# Patient Record
Sex: Female | Born: 1950 | Race: Black or African American | Hispanic: No | Smoking: Never smoker
Health system: Southern US, Community
[De-identification: ages and names within clinical notes are randomized; demographics above are authoritative.]

## PROBLEM LIST (undated history)

## (undated) ENCOUNTER — Emergency Department (HOSPITAL_COMMUNITY): Payer: Medicare Other

## (undated) DIAGNOSIS — R000 Tachycardia, unspecified: Secondary | ICD-10-CM

## (undated) DIAGNOSIS — R140 Abdominal distension (gaseous): Secondary | ICD-10-CM

## (undated) DIAGNOSIS — F329 Major depressive disorder, single episode, unspecified: Secondary | ICD-10-CM

## (undated) DIAGNOSIS — C801 Malignant (primary) neoplasm, unspecified: Secondary | ICD-10-CM

## (undated) DIAGNOSIS — K760 Fatty (change of) liver, not elsewhere classified: Secondary | ICD-10-CM

## (undated) DIAGNOSIS — F419 Anxiety disorder, unspecified: Secondary | ICD-10-CM

## (undated) DIAGNOSIS — K219 Gastro-esophageal reflux disease without esophagitis: Secondary | ICD-10-CM

## (undated) DIAGNOSIS — I10 Essential (primary) hypertension: Secondary | ICD-10-CM

## (undated) DIAGNOSIS — M1990 Unspecified osteoarthritis, unspecified site: Secondary | ICD-10-CM

## (undated) DIAGNOSIS — F32A Depression, unspecified: Secondary | ICD-10-CM

## (undated) DIAGNOSIS — J302 Other seasonal allergic rhinitis: Secondary | ICD-10-CM

## (undated) DIAGNOSIS — I639 Cerebral infarction, unspecified: Secondary | ICD-10-CM

## (undated) HISTORY — PX: BREAST SURGERY: SHX581

## (undated) HISTORY — PX: TUBAL LIGATION: SHX77

## (undated) HISTORY — PX: JOINT REPLACEMENT: SHX530

## (undated) HISTORY — PX: TONSILLECTOMY: SUR1361

## (undated) HISTORY — PX: ABDOMINAL HYSTERECTOMY: SHX81

---

## 1997-12-24 ENCOUNTER — Encounter: Admission: RE | Admit: 1997-12-24 | Discharge: 1998-03-24 | Payer: Self-pay | Admitting: Internal Medicine

## 1998-02-04 ENCOUNTER — Emergency Department (HOSPITAL_COMMUNITY): Admission: EM | Admit: 1998-02-04 | Discharge: 1998-02-04 | Payer: Self-pay | Admitting: Emergency Medicine

## 1998-09-03 ENCOUNTER — Other Ambulatory Visit: Admission: RE | Admit: 1998-09-03 | Discharge: 1998-09-03 | Payer: Self-pay | Admitting: Obstetrics and Gynecology

## 1998-11-17 ENCOUNTER — Other Ambulatory Visit: Admission: RE | Admit: 1998-11-17 | Discharge: 1998-11-17 | Payer: Self-pay | Admitting: Radiology

## 1998-11-29 ENCOUNTER — Other Ambulatory Visit: Admission: RE | Admit: 1998-11-29 | Discharge: 1998-11-29 | Payer: Self-pay | Admitting: Radiology

## 1998-12-08 ENCOUNTER — Encounter: Admission: RE | Admit: 1998-12-08 | Discharge: 1999-03-08 | Payer: Self-pay | Admitting: *Deleted

## 1999-09-12 ENCOUNTER — Other Ambulatory Visit: Admission: RE | Admit: 1999-09-12 | Discharge: 1999-09-12 | Payer: Self-pay | Admitting: Obstetrics and Gynecology

## 1999-12-07 ENCOUNTER — Ambulatory Visit (HOSPITAL_COMMUNITY): Admission: RE | Admit: 1999-12-07 | Discharge: 1999-12-07 | Payer: Self-pay | Admitting: General Surgery

## 1999-12-14 ENCOUNTER — Encounter: Payer: Self-pay | Admitting: Internal Medicine

## 1999-12-14 ENCOUNTER — Encounter: Admission: RE | Admit: 1999-12-14 | Discharge: 1999-12-14 | Payer: Self-pay | Admitting: Internal Medicine

## 1999-12-28 ENCOUNTER — Encounter: Payer: Self-pay | Admitting: Emergency Medicine

## 1999-12-28 ENCOUNTER — Emergency Department (HOSPITAL_COMMUNITY): Admission: EM | Admit: 1999-12-28 | Discharge: 1999-12-28 | Payer: Self-pay | Admitting: Emergency Medicine

## 1999-12-31 ENCOUNTER — Encounter: Payer: Self-pay | Admitting: Emergency Medicine

## 1999-12-31 ENCOUNTER — Emergency Department (HOSPITAL_COMMUNITY): Admission: EM | Admit: 1999-12-31 | Discharge: 1999-12-31 | Payer: Self-pay | Admitting: Emergency Medicine

## 2000-10-01 ENCOUNTER — Other Ambulatory Visit: Admission: RE | Admit: 2000-10-01 | Discharge: 2000-10-01 | Payer: Self-pay | Admitting: Obstetrics and Gynecology

## 2000-11-04 ENCOUNTER — Emergency Department (HOSPITAL_COMMUNITY): Admission: EM | Admit: 2000-11-04 | Discharge: 2000-11-04 | Payer: Self-pay | Admitting: Emergency Medicine

## 2000-11-26 ENCOUNTER — Encounter: Payer: Self-pay | Admitting: Physical Medicine and Rehabilitation

## 2000-11-26 ENCOUNTER — Encounter
Admission: RE | Admit: 2000-11-26 | Discharge: 2000-11-26 | Payer: Self-pay | Admitting: Physical Medicine and Rehabilitation

## 2001-04-22 ENCOUNTER — Encounter: Payer: Self-pay | Admitting: Internal Medicine

## 2001-04-22 ENCOUNTER — Encounter: Admission: RE | Admit: 2001-04-22 | Discharge: 2001-04-22 | Payer: Self-pay | Admitting: Internal Medicine

## 2001-10-07 ENCOUNTER — Other Ambulatory Visit: Admission: RE | Admit: 2001-10-07 | Discharge: 2001-10-07 | Payer: Self-pay | Admitting: Obstetrics and Gynecology

## 2007-08-21 ENCOUNTER — Encounter: Admission: RE | Admit: 2007-08-21 | Discharge: 2007-08-21 | Payer: Self-pay | Admitting: Internal Medicine

## 2007-08-28 ENCOUNTER — Other Ambulatory Visit: Admission: RE | Admit: 2007-08-28 | Discharge: 2007-08-28 | Payer: Self-pay | Admitting: Obstetrics and Gynecology

## 2008-03-31 ENCOUNTER — Emergency Department (HOSPITAL_COMMUNITY): Admission: EM | Admit: 2008-03-31 | Discharge: 2008-04-01 | Payer: Self-pay | Admitting: Emergency Medicine

## 2008-04-01 ENCOUNTER — Ambulatory Visit: Payer: Self-pay | Admitting: Vascular Surgery

## 2008-04-01 ENCOUNTER — Ambulatory Visit (HOSPITAL_COMMUNITY): Admission: RE | Admit: 2008-04-01 | Discharge: 2008-04-01 | Payer: Self-pay | Admitting: Emergency Medicine

## 2008-04-01 ENCOUNTER — Encounter (INDEPENDENT_AMBULATORY_CARE_PROVIDER_SITE_OTHER): Payer: Self-pay | Admitting: Emergency Medicine

## 2009-08-16 ENCOUNTER — Encounter: Admission: RE | Admit: 2009-08-16 | Discharge: 2009-08-16 | Payer: Self-pay | Admitting: Gastroenterology

## 2011-03-28 LAB — D-DIMER, QUANTITATIVE: D-Dimer, Quant: 1.42 — ABNORMAL HIGH

## 2011-04-10 ENCOUNTER — Other Ambulatory Visit: Payer: Self-pay | Admitting: Obstetrics and Gynecology

## 2011-04-10 DIAGNOSIS — R102 Pelvic and perineal pain: Secondary | ICD-10-CM

## 2011-04-14 ENCOUNTER — Ambulatory Visit
Admission: RE | Admit: 2011-04-14 | Discharge: 2011-04-14 | Disposition: A | Discharge status: Home / Self Care | Payer: Medicare Other | Source: Ambulatory Visit | Attending: Obstetrics and Gynecology | Admitting: Obstetrics and Gynecology

## 2011-04-14 DIAGNOSIS — R102 Pelvic and perineal pain: Secondary | ICD-10-CM

## 2011-04-14 MED ORDER — IOHEXOL 300 MG/ML  SOLN: 100.0000 mL | Freq: Once | INTRAMUSCULAR | Status: AC | PRN

## 2011-11-18 ENCOUNTER — Emergency Department (HOSPITAL_COMMUNITY)
Admission: EM | Admit: 2011-11-18 | Discharge: 2011-11-18 | Disposition: A | Discharge status: Home / Self Care | Payer: Medicare Other | Attending: Emergency Medicine | Admitting: Emergency Medicine

## 2011-11-18 ENCOUNTER — Encounter (HOSPITAL_COMMUNITY): Payer: Self-pay

## 2011-11-18 ENCOUNTER — Other Ambulatory Visit: Payer: Self-pay

## 2011-11-18 ENCOUNTER — Emergency Department (HOSPITAL_COMMUNITY): Payer: Medicare Other

## 2011-11-18 DIAGNOSIS — H04123 Dry eye syndrome of bilateral lacrimal glands: Secondary | ICD-10-CM

## 2011-11-18 DIAGNOSIS — H04129 Dry eye syndrome of unspecified lacrimal gland: Secondary | ICD-10-CM | POA: Insufficient documentation

## 2011-11-18 DIAGNOSIS — F439 Reaction to severe stress, unspecified: Secondary | ICD-10-CM

## 2011-11-18 DIAGNOSIS — R200 Anesthesia of skin: Secondary | ICD-10-CM

## 2011-11-18 DIAGNOSIS — Z79899 Other long term (current) drug therapy: Secondary | ICD-10-CM | POA: Insufficient documentation

## 2011-11-18 DIAGNOSIS — Z733 Stress, not elsewhere classified: Secondary | ICD-10-CM | POA: Insufficient documentation

## 2011-11-18 DIAGNOSIS — R209 Unspecified disturbances of skin sensation: Secondary | ICD-10-CM | POA: Insufficient documentation

## 2011-11-18 DIAGNOSIS — I10 Essential (primary) hypertension: Secondary | ICD-10-CM | POA: Insufficient documentation

## 2011-11-18 HISTORY — DX: Essential (primary) hypertension: I10

## 2011-11-18 LAB — POCT I-STAT, CHEM 8
Hemoglobin: 13.3 g/dL (ref 12.0–15.0)
Sodium: 139 mEq/L (ref 135–145)
TCO2: 30 mmol/L (ref 0–100)

## 2011-11-18 MED ORDER — TETRACAINE HCL 0.5 % OP SOLN
OPHTHALMIC | Status: AC
  Filled 2011-11-18: qty 2

## 2011-11-18 MED ORDER — FLUORESCEIN SODIUM 1 MG OP STRP
ORAL_STRIP | OPHTHALMIC | Status: AC
  Filled 2011-11-18: qty 1

## 2011-11-18 NOTE — ED Notes (Signed)
Pt reports waking up this am with a red, blurry eye.  Feels like she has something stuck in her eye.  Reports that last night she did not have this sensation.  States that she has been seen at [facility] this am and was advised to come here for further eval of (L) sided facial numbness.  No difficulty ambulating, no drift or droop noted.  Pt A/o x 4.  No trouble speaking.

## 2011-11-18 NOTE — ED Provider Notes (Addendum)
History     CSN: [id number]  Arrival date & time 11/18/11  1559   First MD Initiated Contact with Patient 11/18/11 1638      Chief Complaint  Patient presents with  . Numbness     HPI Pt. Reports having lt. Facial numbness and lt. Eye is blurry symptoms began 1 month ago and are intermittent, pt. Denies any pain speech is Clear. She reports being under tremendous stress. [facility] sent her to [place] today for further eval  Past Medical History  Diagnosis Date  . Hypertension     History reviewed. No pertinent past surgical history.  No family history on file.  History  Substance Use Topics  . Smoking status: Never Smoker   . Smokeless tobacco: Not on file  . Alcohol Use: No    OB History    Grav Para Term Preterm Abortions TAB SAB Ect Mult Living                  Review of Systems  All other systems reviewed and are negative.    Allergies  Review of patient's allergies indicates no known allergies.  Home Medications   Current Outpatient Rx  Name Route Sig Dispense Refill  . ALPRAZOLAM 0.5 MG PO TABS Oral Take 0.5 mg by mouth at bedtime as needed. For sleep    . BUSPAR PO Oral Take 1 tablet by mouth 2 (two) times daily.    Marland Kitchen LISINOPRIL-HYDROCHLOROTHIAZIDE 20-25 MG PO TABS Oral Take 1 tablet by mouth daily.    Marland Kitchen METAXALONE 400 MG HALF TABLET Oral Take 800 mg by mouth 2 (two) times daily as needed. For muscle pain    . METOPROLOL TARTRATE PO Oral Take 1 tablet by mouth 2 (two) times daily.    Marland Kitchen PROBIOTIC FORMULA PO CAPS Oral Take 1 capsule by mouth daily.      BP 149/66  Pulse 59  Temp(Src) 98 F (36.7 C) (Oral)  Resp 16  SpO2 100%  Physical Exam  Nursing note and vitals reviewed. Constitutional: She is oriented to person, place, and time. She appears well-developed and well-nourished. No distress.  HENT:  Head: Normocephalic and atraumatic.  Eyes: Pupils are equal, round, and reactive to light. Left conjunctiva is injected.   Fluorescein stain of left eye reveals superficial stippling but no definite abrasion or laceration  Neck: Normal range of motion.  Cardiovascular: Normal rate and intact distal pulses.   Pulmonary/Chest: No respiratory distress.  Abdominal: Normal appearance. She exhibits no distension.  Musculoskeletal: Normal range of motion.  Neurological: She is alert and oriented to person, place, and time. No cranial nerve deficit.  Skin: Skin is warm and dry. No rash noted.  Psychiatric: She has a normal mood and affect. Her behavior is normal.    ED Course  Procedures (including critical care time)  Labs Reviewed  POCT I-STAT, CHEM 8 - Abnormal; Notable for the following:    Potassium 3.3 (*)    All other components within normal limits   Mr Brain Wo Contrast  11/18/2011  *RADIOLOGY REPORT*  Clinical Data: Right sided facial numbness.  MRI HEAD WITHOUT CONTRAST  Technique:  Multiplanar, multiecho pulse sequences of the brain and surrounding structures were obtained according to standard protocol without intravenous contrast.  Comparison: None.  Findings: The brain has a normal appearance without evidence of malformation, atrophy, old or acute infarction, mass lesion, hemorrhage, hydrocephalus or extra-axial collection.  No pituitary mass.  No inflammatory sinus disease.  Major  vessels are patent at the base of the brain.  IMPRESSION: Normal MRI of the brain.  Original Report Authenticated By: Thomasenia Sales, M.D.     1. Stress   2. Numbness   3. Dry eyes       MDM  After treatment in the ED the patient feels back to baseline and wants to go home.         Nelia Shi, MD 11/18/11 1945  Nelia Shi, MD 01/04/12 [phone]

## 2011-11-18 NOTE — ED Notes (Signed)
Pt denies any questions or pain upon discharge, verbalize understanding of discharge instructions and follow up.

## 2011-11-18 NOTE — ED Notes (Signed)
Pt being transported to MRI.

## 2011-11-18 NOTE — ED Notes (Signed)
Pt. Reports having lt. Facial numbness and lt. Eye is blurry symptoms began 1 month ago and are intermittent, pt. Denies any pain speech is  Clear. She reports being under tremendous stress. [facility] sent her to [place] today for further eval
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
## 2013-11-29 ENCOUNTER — Encounter (HOSPITAL_COMMUNITY): Payer: Self-pay | Admitting: Emergency Medicine

## 2013-11-29 ENCOUNTER — Emergency Department (HOSPITAL_COMMUNITY)
Admission: EM | Admit: 2013-11-29 | Discharge: 2013-11-29 | Disposition: A | Discharge status: Home / Self Care | Payer: Medicare Other | Attending: Emergency Medicine | Admitting: Emergency Medicine

## 2013-11-29 DIAGNOSIS — Z880 Allergy status to penicillin: Secondary | ICD-10-CM | POA: Insufficient documentation

## 2013-11-29 DIAGNOSIS — I10 Essential (primary) hypertension: Secondary | ICD-10-CM | POA: Insufficient documentation

## 2013-11-29 DIAGNOSIS — M25562 Pain in left knee: Secondary | ICD-10-CM

## 2013-11-29 DIAGNOSIS — M25569 Pain in unspecified knee: Secondary | ICD-10-CM | POA: Insufficient documentation

## 2013-11-29 DIAGNOSIS — Z79899 Other long term (current) drug therapy: Secondary | ICD-10-CM | POA: Insufficient documentation

## 2013-11-29 DIAGNOSIS — Z87828 Personal history of other (healed) physical injury and trauma: Secondary | ICD-10-CM | POA: Insufficient documentation

## 2013-11-29 MED ORDER — TRAMADOL HCL 50 MG PO TABS: 50.0000 mg | ORAL_TABLET | Freq: Four times a day (QID) | ORAL | Status: DC | PRN

## 2013-11-29 NOTE — ED Provider Notes (Signed)
CSN: [id number]     Arrival date & time 11/29/13  1521 History  This chart was scribed for non-physician practitioner working with Morgan Jacobsen, MD by Mercy Moore, ED Scribe. This patient was seen in room TR10C/TR10C and the patient's care was started at 6:52 PM.   Chief Complaint  Patient presents with  . Knee Pain     The history is provided by the patient. No language interpreter was used.   HPI Comments: Morgan Carrillo is a 63 y.o. female who presents to the Emergency Department complaining of intermittent lower left leg pain since falling over a month ago. Patient has completed physical therapy after her fall and reports improvement of her knee pain.  However, patient went for a walk this morning with her grand daugther and is now having increased pain in her left knee. Patient describes pain as a scraping, grating sensation inside of the knee.  Patient being seen by Dr. Rhona Raider.   Past Medical History  Diagnosis Date  . Hypertension    History reviewed. No pertinent past surgical history. History reviewed. No pertinent family history. History  Substance Use Topics  . Smoking status: Never Smoker   . Smokeless tobacco: Not on file  . Alcohol Use: No   OB History   Grav Para Term Preterm Abortions TAB SAB Ect Mult Living                 Review of Systems    Allergies  Codeine; Sulfa antibiotics; and Penicillins  Home Medications   Prior to Admission medications   Medication Sig Start Date End Date Taking? Authorizing Provider  ALPRAZolam Duanne Moron) 0.5 MG tablet Take 0.5 mg by mouth at bedtime as needed. For sleep    Historical Provider, MD  BusPIRone HCl (BUSPAR PO) Take 1 tablet by mouth 2 (two) times daily.    Historical Provider, MD  lisinopril-hydrochlorothiazide (PRINZIDE,ZESTORETIC) 20-25 MG per tablet Take 1 tablet by mouth daily.    Historical Provider, MD  metaxalone (SKELAXIN) 400 MG tablet Take 800 mg by mouth 2 (two) times daily as needed. For  muscle pain    Historical Provider, MD  METOPROLOL TARTRATE PO Take 1 tablet by mouth 2 (two) times daily.    Historical Provider, MD  Probiotic Product (PROBIOTIC FORMULA) CAPS Take 1 capsule by mouth daily.    Historical Provider, MD   Triage Vitals: BP 150/80  Pulse 70  Temp(Src) 97.9 F (36.6 C) (Oral)  Resp 18  Ht 5\' 4"  (1.626 m)  Wt 165 lb (74.844 kg)  BMI 28.31 kg/m2  SpO2 96% Physical Exam  Nursing note and vitals reviewed. Constitutional: She is oriented to person, place, and time. She appears well-developed and well-nourished. No distress.  HENT:  Head: Normocephalic and atraumatic.  Eyes: EOM are normal.  Neck: Neck supple. No tracheal deviation present.  Cardiovascular: Normal rate.   Pulmonary/Chest: Effort normal. No respiratory distress.  Musculoskeletal: Normal range of motion.  Left knee: Tender to palpation at the pes anserinus insertion. Ligaments are stable. Negative Lachman's and  McMurray. No swelling. No crepitus.   Neurological: She is alert and oriented to person, place, and time.  Skin: Skin is warm and dry.  Psychiatric: She has a normal mood and affect. Her behavior is normal.    ED Course  Procedures (including critical care time) DIAGNOSTIC STUDIES: Oxygen Saturation is 96% on room air, adequate by my interpretation.    COORDINATION OF CARE: 6:57 PM- Discussed treatment plan with patient  at bedside and patient agreed to plan.  SPLINT APPLICATION SPQZ/RAQT:6:22 PM Authorized by: Margarita Mail Consent: Verbal consent obtained. Risks and benefits: risks, benefits and alternatives were discussed Consent given by: patient Splint applied by: orthopedic technician Location details: L knee Splint type: sleeve Supplies used: knee sleeve Post-procedure: The splinted body part was neurovascularly unchanged following the procedure. Patient tolerance: Patient tolerated the procedure well with no immediate complications.      Labs Review Labs  Reviewed - No data to display  Imaging Review No results found.   EKG Interpretation None      MDM   Final diagnoses:  Knee pain, left   BP 150/80  Pulse 60  Temp(Src) 98.3 F (36.8 C) (Oral)  Resp 18  Ht 5\' 4"  (1.626 m)  Wt 165 lb (74.844 kg)  BMI 28.31 kg/m2  SpO2 99%  Patient PE consistent with either pes anserine bursitis or tendinitis. I have place the patient in compression sleeve and crutches.  RICE protocol. Tramadol for pain. i opted out of NSAIDS as patient is hypertensive. She has an established relationship with Dr. Rhona Raider and would like to follow with him.  I personally performed the services described in this documentation, which was scribed in my presence. The recorded information has been reviewed and is accurate.    Margarita Mail, PA-C 12/01/13 [phone]

## 2013-11-29 NOTE — Discharge Instructions (Signed)
Tendinitis Tendinitis is swelling and inflammation of the tendons. Tendons are band-like tissues that connect muscle to bone. Tendinitis commonly occurs in the:   Shoulders (rotator cuff).  Heels (Achilles tendon).  Elbows (triceps tendon). CAUSES Tendinitis is usually caused by overusing the tendon, muscles, and joints involved. When the tissue surrounding a tendon (synovium) becomes inflamed, it is called tenosynovitis. Tendinitis commonly develops in people whose jobs require repetitive motions. SYMPTOMS  Pain.  Tenderness.  Mild swelling. DIAGNOSIS Tendinitis is usually diagnosed by physical exam. Your caregiver may also order X-rays or other imaging tests. TREATMENT Your caregiver may recommend certain medicines or exercises for your treatment. HOME CARE INSTRUCTIONS   Use a sling or splint for as long as directed by your caregiver until the pain decreases.  Put ice on the injured area.  Put ice in a plastic bag.  Place a towel between your skin and the bag.  Leave the ice on for 15-20 minutes, 03-04 times a day.  Avoid using the limb while the tendon is painful. Perform gentle range of motion exercises only as directed by your caregiver. Stop exercises if pain or discomfort increase, unless directed otherwise by your caregiver.  Only take over-the-counter or prescription medicines for pain, discomfort, or fever as directed by your caregiver. SEEK MEDICAL CARE IF:   Your pain and swelling increase.  You develop new, unexplained symptoms, especially increased numbness in the hands. MAKE SURE YOU:   Understand these instructions.  Will watch your condition.  Will get help right away if you are not doing well or get worse. Document Released: 06/09/2000 Document Revised: 09/04/2011 Document Reviewed: 08/29/2010 [facility] Patient Information 2014 [place], [place].

## 2013-11-29 NOTE — ED Notes (Signed)
Pt reports hx of fall one month ago which caused pain to left lower leg, went to physical therapy for it. Did a walk this morning and now having pain to left knee. Ambulatory at triage.

## 2013-11-29 NOTE — ED Notes (Signed)
Pt d/c with grandchild, ambulatory with clutches, a pain level of 7. Discharge teaching done.

## 2013-11-29 NOTE — Progress Notes (Signed)
Orthopedic Tech Progress Note Patient Details:  Morgan Carrillo [facility] Oct 28, 1950 [id number]  Ortho Devices Type of Ortho Device: Crutches;Knee Sleeve Ortho Device/Splint Location: lle Ortho Device/Splint Interventions: Application   Keeana Pieratt 11/29/2013, 7:26 PM

## 2013-12-01 NOTE — ED Provider Notes (Signed)
Medical screening examination/treatment/procedure(s) were performed by non-physician practitioner and as supervising physician I was immediately available for consultation/collaboration.  [name], MD 12/01/13 [phone]

## 2014-07-22 ENCOUNTER — Other Ambulatory Visit (HOSPITAL_COMMUNITY): Payer: Self-pay | Admitting: Orthopaedic Surgery

## 2014-07-22 ENCOUNTER — Ambulatory Visit (HOSPITAL_COMMUNITY)
Admission: RE | Admit: 2014-07-22 | Discharge: 2014-07-22 | Disposition: A | Discharge status: Home / Self Care | Payer: BC Managed Care – PPO | Source: Ambulatory Visit | Attending: Orthopaedic Surgery | Admitting: Orthopaedic Surgery

## 2014-07-22 DIAGNOSIS — M25562 Pain in left knee: Secondary | ICD-10-CM | POA: Diagnosis not present

## 2014-07-22 DIAGNOSIS — Z01818 Encounter for other preprocedural examination: Secondary | ICD-10-CM | POA: Diagnosis not present

## 2014-07-22 DIAGNOSIS — Z01810 Encounter for preprocedural cardiovascular examination: Secondary | ICD-10-CM

## 2014-07-22 NOTE — Progress Notes (Signed)
*  PRELIMINARY RESULTS* Vascular Ultrasound Left lower extremity venous duplex has been completed.  Preliminary findings: no evidence of DVT.    Landry Mellow, RDMS, RVT  07/22/2014, 10:49 AM

## 2015-10-19 ENCOUNTER — Other Ambulatory Visit: Payer: Self-pay | Admitting: Internal Medicine

## 2015-10-19 DIAGNOSIS — R140 Abdominal distension (gaseous): Secondary | ICD-10-CM

## 2015-10-19 DIAGNOSIS — R109 Unspecified abdominal pain: Secondary | ICD-10-CM

## 2015-10-20 ENCOUNTER — Other Ambulatory Visit: Payer: Self-pay | Admitting: Gastroenterology

## 2015-10-25 ENCOUNTER — Ambulatory Visit
Admission: RE | Admit: 2015-10-25 | Discharge: 2015-10-25 | Disposition: A | Discharge status: Home / Self Care | Payer: Medicare Other | Source: Ambulatory Visit | Attending: Internal Medicine | Admitting: Internal Medicine

## 2015-10-25 DIAGNOSIS — R140 Abdominal distension (gaseous): Secondary | ICD-10-CM

## 2015-10-25 DIAGNOSIS — R109 Unspecified abdominal pain: Secondary | ICD-10-CM

## 2015-11-05 ENCOUNTER — Encounter (HOSPITAL_COMMUNITY): Payer: Self-pay | Admitting: *Deleted

## 2015-11-16 ENCOUNTER — Ambulatory Visit (HOSPITAL_COMMUNITY)
Admission: RE | Admit: 2015-11-16 | Discharge: 2015-11-16 | Disposition: A | Discharge status: Home / Self Care | Payer: Medicare Other | Source: Ambulatory Visit | Attending: Gastroenterology | Admitting: Gastroenterology

## 2015-11-16 ENCOUNTER — Encounter (HOSPITAL_COMMUNITY): Payer: Self-pay | Admitting: Anesthesiology

## 2015-11-16 ENCOUNTER — Ambulatory Visit (HOSPITAL_COMMUNITY): Payer: Medicare Other | Admitting: Anesthesiology

## 2015-11-16 ENCOUNTER — Encounter (HOSPITAL_COMMUNITY)
Admission: RE | Disposition: A | Discharge status: Home / Self Care | Payer: Self-pay | Source: Ambulatory Visit | Attending: Gastroenterology

## 2015-11-16 DIAGNOSIS — Z853 Personal history of malignant neoplasm of breast: Secondary | ICD-10-CM | POA: Insufficient documentation

## 2015-11-16 DIAGNOSIS — D123 Benign neoplasm of transverse colon: Secondary | ICD-10-CM | POA: Diagnosis not present

## 2015-11-16 DIAGNOSIS — I10 Essential (primary) hypertension: Secondary | ICD-10-CM | POA: Insufficient documentation

## 2015-11-16 DIAGNOSIS — Z8673 Personal history of transient ischemic attack (TIA), and cerebral infarction without residual deficits: Secondary | ICD-10-CM | POA: Insufficient documentation

## 2015-11-16 DIAGNOSIS — Z96652 Presence of left artificial knee joint: Secondary | ICD-10-CM | POA: Insufficient documentation

## 2015-11-16 DIAGNOSIS — K5900 Constipation, unspecified: Secondary | ICD-10-CM | POA: Diagnosis not present

## 2015-11-16 DIAGNOSIS — M1990 Unspecified osteoarthritis, unspecified site: Secondary | ICD-10-CM | POA: Insufficient documentation

## 2015-11-16 DIAGNOSIS — R140 Abdominal distension (gaseous): Secondary | ICD-10-CM | POA: Diagnosis not present

## 2015-11-16 DIAGNOSIS — E119 Type 2 diabetes mellitus without complications: Secondary | ICD-10-CM | POA: Diagnosis not present

## 2015-11-16 DIAGNOSIS — D125 Benign neoplasm of sigmoid colon: Secondary | ICD-10-CM | POA: Diagnosis not present

## 2015-11-16 HISTORY — DX: Other seasonal allergic rhinitis: J30.2

## 2015-11-16 HISTORY — DX: Malignant (primary) neoplasm, unspecified: C80.1

## 2015-11-16 HISTORY — DX: Tachycardia, unspecified: R00.0

## 2015-11-16 HISTORY — DX: Unspecified osteoarthritis, unspecified site: M19.90

## 2015-11-16 HISTORY — DX: Anxiety disorder, unspecified: F41.9

## 2015-11-16 HISTORY — DX: Depression, unspecified: F32.A

## 2015-11-16 HISTORY — DX: Major depressive disorder, single episode, unspecified: F32.9

## 2015-11-16 HISTORY — DX: Abdominal distension (gaseous): R14.0

## 2015-11-16 HISTORY — DX: Gastro-esophageal reflux disease without esophagitis: K21.9

## 2015-11-16 HISTORY — DX: Fatty (change of) liver, not elsewhere classified: K76.0

## 2015-11-16 HISTORY — DX: Cerebral infarction, unspecified: I63.9

## 2015-11-16 HISTORY — PX: COLONOSCOPY WITH PROPOFOL: SHX5780

## 2015-11-16 SURGERY — COLONOSCOPY WITH PROPOFOL
Anesthesia: Monitor Anesthesia Care

## 2015-11-16 MED ORDER — PHENYLEPHRINE HCL 10 MG/ML IJ SOLN
INTRAMUSCULAR | Status: DC | PRN
  Administered 2015-11-16: 80 ug via INTRAVENOUS

## 2015-11-16 MED ORDER — PROPOFOL 500 MG/50ML IV EMUL
INTRAVENOUS | Status: DC | PRN
  Administered 2015-11-16: 50 mg via INTRAVENOUS

## 2015-11-16 MED ORDER — LACTATED RINGERS IV SOLN
INTRAVENOUS | Status: DC
  Administered 2015-11-16: 09:00:00 via INTRAVENOUS
  Administered 2015-11-16: 1000 mL via INTRAVENOUS

## 2015-11-16 MED ORDER — PROPOFOL 500 MG/50ML IV EMUL
INTRAVENOUS | Status: DC | PRN
  Administered 2015-11-16: 150 ug/kg/min via INTRAVENOUS

## 2015-11-16 MED ORDER — PROPOFOL 10 MG/ML IV BOLUS
INTRAVENOUS | Status: AC
  Filled 2015-11-16: qty 40

## 2015-11-16 MED ORDER — PHENYLEPHRINE 40 MCG/ML (10ML) SYRINGE FOR IV PUSH (FOR BLOOD PRESSURE SUPPORT)
PREFILLED_SYRINGE | INTRAVENOUS | Status: AC
  Filled 2015-11-16: qty 10

## 2015-11-16 MED ORDER — SODIUM CHLORIDE 0.9 % IV SOLN: INTRAVENOUS | Status: DC

## 2015-11-16 SURGICAL SUPPLY — 21 items

## 2015-11-16 NOTE — Discharge Instructions (Signed)

## 2015-11-16 NOTE — Anesthesia Postprocedure Evaluation (Signed)
Anesthesia Post Note  Patient: Morgan Carrillo [facility]  Procedure(s) Performed: Procedure(s) (LRB): COLONOSCOPY WITH PROPOFOL (N/A)  Patient location during evaluation: PACU Anesthesia Type: MAC Level of consciousness: awake and alert Pain management: pain level controlled Vital Signs Assessment: post-procedure vital signs reviewed and stable Respiratory status: spontaneous breathing, nonlabored ventilation, respiratory function stable and patient connected to nasal cannula oxygen Cardiovascular status: stable and blood pressure returned to baseline Anesthetic complications: no    Last Vitals:  Filed Vitals:   11/16/15 1000 11/16/15 1010  BP: 100/51 114/41  Pulse: 73 68  Temp:    Resp: 15 17    Last Pain: There were no vitals filed for this visit.               Alphonsine Minium J

## 2015-11-16 NOTE — Op Note (Signed)
[facility] Patient Name: Morgan Carrillo Procedure Date: 11/16/2015 MRN: [id number] Attending MD: Garlan Fair , MD Date of Birth: 04-25-1951 CSN: [id number] Age: 65 Admit Type: Outpatient Procedure:                Colonoscopy Indications:              Screening for colorectal malignant neoplasm Providers:                Garlan Fair, MD, Hilma Favors, RN, Zenon Mayo, RN, Cherylynn Ridges, Technician, Arnoldo Hooker, CRNA Referring MD:              Medicines:                Propofol per Anesthesia Complications:            No immediate complications. Estimated Blood Loss:     Estimated blood loss was minimal. Procedure:                Pre-Anesthesia Assessment:                           - Prior to the procedure, a History and Physical                            was performed, and patient medications and                            allergies were reviewed. The patient's tolerance of                            previous anesthesia was also reviewed. The risks                            and benefits of the procedure and the sedation                            options and risks were discussed with the patient.                            All questions were answered, and informed consent                            was obtained. Prior Anticoagulants: The patient has                            taken no previous anticoagulant or antiplatelet                            agents. ASA Grade Assessment: II - A patient with                            mild systemic disease. After reviewing the risks  and benefits, the patient was deemed in                            satisfactory condition to undergo the procedure.                           After obtaining informed consent, the colonoscope                            was passed under direct vision. Throughout the                            procedure, the patient's blood pressure,  pulse, and                            oxygen saturations were monitored continuously. The                            [id number]) scope was introduced through                            the anus and advanced to the the cecum, identified                            by appendiceal orifice and ileocecal valve. The                            colonoscopy was performed without difficulty. The                            patient tolerated the procedure well. The quality                            of the bowel preparation was good. The terminal                            ileum, the ileocecal valve, the appendiceal orifice                            and the rectum were photographed. Scope In: 9:18:07 AM Scope Out: 9:45:07 AM Scope Withdrawal Time: 0 hours 20 minutes 51 seconds  Total Procedure Duration: 0 hours 27 minutes 0 seconds  Findings:      The perianal and digital rectal examinations were normal.      A 6 mm polyp was found in the hepatic flexure. The polyp was sessile.       The polyp was removed with a cold snare. Resection and retrieval were       complete. An Endo Clip was applied to the polypectomy site to prevent       bleeding.      A 4 mm polyp was found in the mid sigmoid colon. The polyp was sessile.       The polyp was removed with a cold snare. Resection and retrieval were       complete.      The  exam was otherwise without abnormality. Impression:               - One 6 mm polyp at the hepatic flexure, removed                            with a cold snare. Resected and retrieved.                           - One 4 mm polyp in the mid sigmoid colon, removed                            with a cold snare. Resected and retrieved.                           - The examination was otherwise normal. Moderate Sedation:      N/A- Per Anesthesia Care Recommendation:           - Patient has a contact number available for                            emergencies. The signs and symptoms  of potential                            delayed complications were discussed with the                            patient. Return to normal activities tomorrow.                            Written discharge instructions were provided to the                            patient.                           - Repeat colonoscopy date to be determined after                            pending pathology results are reviewed for                            surveillance.                           - Resume previous diet.                           - Continue present medications. Procedure Code(s):        --- Professional ---                           [phone], Colonoscopy, flexible; with removal of                            tumor(s), polyp(s), or other lesion(s) by snare  technique Diagnosis Code(s):        --- Professional ---                           Z12.11, Encounter for screening for malignant                            neoplasm of colon                           D12.3, Benign neoplasm of transverse colon (hepatic                            flexure or splenic flexure)                           D12.5, Benign neoplasm of sigmoid colon CPT copyright 2016 American Medical Association. All rights reserved. The codes documented in this report are preliminary and upon coder review may  be revised to meet current compliance requirements. Earle Gell, MD Garlan Fair, MD 11/16/2015 9:45:18 AM This report has been signed electronically. Number of Addenda: 0

## 2015-11-16 NOTE — Anesthesia Preprocedure Evaluation (Signed)
Anesthesia Evaluation  Patient identified by MRN, date of birth, ID band Patient awake    Reviewed: Allergy & Precautions, NPO status , Patient's Chart, lab work & pertinent test results  Airway Mallampati: II  TM Distance: >3 FB Neck ROM: Full    Dental no notable dental hx.    Pulmonary neg pulmonary ROS,    Pulmonary exam normal breath sounds clear to auscultation       Cardiovascular hypertension, Pt. on medications and Pt. on home beta blockers Normal cardiovascular exam Rhythm:Regular Rate:Normal     Neuro/Psych PSYCHIATRIC DISORDERS Anxiety Depression CVA    GI/Hepatic Neg liver ROS, GERD  ,  Endo/Other  negative endocrine ROS  Renal/GU negative Renal ROS  negative genitourinary   Musculoskeletal  (+) Arthritis ,   Abdominal   Peds negative pediatric ROS (+)  Hematology negative hematology ROS (+)   Anesthesia Other Findings   Reproductive/Obstetrics negative OB ROS                             Anesthesia Physical Anesthesia Plan  ASA: III  Anesthesia Plan: MAC   Post-op Pain Management:    Induction: Intravenous  Airway Management Planned: Natural Airway  Additional Equipment:   Intra-op Plan:   Post-operative Plan:   Informed Consent: I have reviewed the patients History and Physical, chart, labs and discussed the procedure including the risks, benefits and alternatives for the proposed anesthesia with the patient or authorized representative who has indicated his/her understanding and acceptance.   Dental advisory given  Plan Discussed with: CRNA  Anesthesia Plan Comments:         Anesthesia Quick Evaluation

## 2015-11-16 NOTE — H&P (Signed)
  Procedure: Screening colonoscopy. Constipation and abdominal bloating sensation on oxybutynin. Normal screening colonoscopy was performed on 10/08/2006  History: The patient is a 65 year old female born 02/17/1951. She takes oxybutynin twice daily and has been experiencing constipation with abdominal bloating but no gastrointestinal bleeding. I suspect her gastrointestinal symptoms are a side effect of the oxybutynin.  She is scheduled to undergo a repeat screening colonoscopy today.  Past medical history: Hypertension. Breast cancer surgery. Anxiety with depression. Type 2 diabetes mellitus. Gastroesophageal reflux. Urinary incontinence. Hysterectomy with left oophorectomy. Uterine fibroid tumors. Left total knee replacement surgeries.  Medication allergies: Penicillin. Sulfa. Codeine. Tramadol  Exam: Patient is alert and lying comfortably on the endoscopy stricture. Abdomen is soft and nontender to palpation. Lungs are clear to auscultation. Cardiac exam reveals a regular rhythm.  Plan: Proceed with screening colonoscopy

## 2015-11-16 NOTE — Transfer of Care (Signed)
Immediate Anesthesia Transfer of Care Note  Patient: Morgan Carrillo [facility]  Procedure(s) Performed: Procedure(s): COLONOSCOPY WITH PROPOFOL (N/A)  Patient Location: PACU  Anesthesia Type:MAC  Level of Consciousness:  sedated, patient cooperative and responds to stimulation  Airway & Oxygen Therapy:Patient Spontanous Breathing and Patient connected to face mask oxgen  Post-op Assessment:  Report given to PACU RN and Post -op Vital signs reviewed and stable  Post vital signs:  Reviewed and stable  Last Vitals:  Filed Vitals:   11/16/15 0746  BP: 134/61  Pulse: 76  Temp: 36.5 C  Resp: 19    Complications: No apparent anesthesia complications

## 2015-11-17 ENCOUNTER — Encounter (HOSPITAL_COMMUNITY): Payer: Self-pay | Admitting: Gastroenterology

## 2016-07-20 ENCOUNTER — Other Ambulatory Visit: Payer: Self-pay | Admitting: Internal Medicine

## 2016-07-20 DIAGNOSIS — R109 Unspecified abdominal pain: Secondary | ICD-10-CM

## 2016-07-24 ENCOUNTER — Other Ambulatory Visit: Payer: Medicare Other

## 2016-07-24 ENCOUNTER — Ambulatory Visit
Admission: RE | Admit: 2016-07-24 | Discharge: 2016-07-24 | Disposition: A | Discharge status: Home / Self Care | Payer: Medicare Other | Source: Ambulatory Visit | Attending: Internal Medicine | Admitting: Internal Medicine

## 2016-07-24 DIAGNOSIS — R109 Unspecified abdominal pain: Secondary | ICD-10-CM

## 2016-07-24 MED ORDER — IOPAMIDOL (ISOVUE-300) INJECTION 61%
100.0000 mL | Freq: Once | INTRAVENOUS | Status: AC | PRN
  Administered 2016-07-24: 100 mL via INTRAVENOUS

## 2017-07-12 DIAGNOSIS — E119 Type 2 diabetes mellitus without complications: Secondary | ICD-10-CM | POA: Diagnosis not present

## 2017-07-12 DIAGNOSIS — R69 Illness, unspecified: Secondary | ICD-10-CM | POA: Diagnosis not present

## 2017-07-12 DIAGNOSIS — F324 Major depressive disorder, single episode, in partial remission: Secondary | ICD-10-CM | POA: Diagnosis not present

## 2017-07-12 DIAGNOSIS — Z7984 Long term (current) use of oral hypoglycemic drugs: Secondary | ICD-10-CM | POA: Diagnosis not present

## 2017-07-12 DIAGNOSIS — I10 Essential (primary) hypertension: Secondary | ICD-10-CM | POA: Diagnosis not present

## 2017-07-12 DIAGNOSIS — C50912 Malignant neoplasm of unspecified site of left female breast: Secondary | ICD-10-CM | POA: Diagnosis not present

## 2017-07-12 DIAGNOSIS — F325 Major depressive disorder, single episode, in full remission: Secondary | ICD-10-CM | POA: Diagnosis not present

## 2017-07-30 DIAGNOSIS — K219 Gastro-esophageal reflux disease without esophagitis: Secondary | ICD-10-CM | POA: Diagnosis not present

## 2017-07-30 DIAGNOSIS — I10 Essential (primary) hypertension: Secondary | ICD-10-CM | POA: Diagnosis not present

## 2017-07-30 DIAGNOSIS — Z1389 Encounter for screening for other disorder: Secondary | ICD-10-CM | POA: Diagnosis not present

## 2017-07-30 DIAGNOSIS — D1803 Hemangioma of intra-abdominal structures: Secondary | ICD-10-CM | POA: Diagnosis not present

## 2017-07-30 DIAGNOSIS — R32 Unspecified urinary incontinence: Secondary | ICD-10-CM | POA: Diagnosis not present

## 2017-07-30 DIAGNOSIS — E119 Type 2 diabetes mellitus without complications: Secondary | ICD-10-CM | POA: Diagnosis not present

## 2017-07-30 DIAGNOSIS — C50912 Malignant neoplasm of unspecified site of left female breast: Secondary | ICD-10-CM | POA: Diagnosis not present

## 2017-07-30 DIAGNOSIS — Z0000 Encounter for general adult medical examination without abnormal findings: Secondary | ICD-10-CM | POA: Diagnosis not present

## 2017-07-30 DIAGNOSIS — K5900 Constipation, unspecified: Secondary | ICD-10-CM | POA: Diagnosis not present

## 2017-07-30 DIAGNOSIS — R69 Illness, unspecified: Secondary | ICD-10-CM | POA: Diagnosis not present

## 2017-08-01 DIAGNOSIS — Z853 Personal history of malignant neoplasm of breast: Secondary | ICD-10-CM | POA: Diagnosis not present

## 2017-08-01 DIAGNOSIS — Z1231 Encounter for screening mammogram for malignant neoplasm of breast: Secondary | ICD-10-CM | POA: Diagnosis not present

## 2017-09-09 DIAGNOSIS — J018 Other acute sinusitis: Secondary | ICD-10-CM | POA: Diagnosis not present

## 2017-09-09 DIAGNOSIS — J309 Allergic rhinitis, unspecified: Secondary | ICD-10-CM | POA: Diagnosis not present

## 2017-09-09 DIAGNOSIS — J209 Acute bronchitis, unspecified: Secondary | ICD-10-CM | POA: Diagnosis not present

## 2017-09-14 DIAGNOSIS — R0981 Nasal congestion: Secondary | ICD-10-CM | POA: Diagnosis not present

## 2017-09-14 DIAGNOSIS — J309 Allergic rhinitis, unspecified: Secondary | ICD-10-CM | POA: Diagnosis not present

## 2017-11-22 DIAGNOSIS — C50912 Malignant neoplasm of unspecified site of left female breast: Secondary | ICD-10-CM | POA: Diagnosis not present

## 2017-11-22 DIAGNOSIS — K219 Gastro-esophageal reflux disease without esophagitis: Secondary | ICD-10-CM | POA: Diagnosis not present

## 2017-11-22 DIAGNOSIS — I10 Essential (primary) hypertension: Secondary | ICD-10-CM | POA: Diagnosis not present

## 2017-11-22 DIAGNOSIS — E1169 Type 2 diabetes mellitus with other specified complication: Secondary | ICD-10-CM | POA: Diagnosis not present

## 2017-11-22 DIAGNOSIS — D1803 Hemangioma of intra-abdominal structures: Secondary | ICD-10-CM | POA: Diagnosis not present

## 2017-11-22 DIAGNOSIS — R69 Illness, unspecified: Secondary | ICD-10-CM | POA: Diagnosis not present

## 2017-11-27 DIAGNOSIS — G8929 Other chronic pain: Secondary | ICD-10-CM | POA: Diagnosis not present

## 2017-11-27 DIAGNOSIS — E119 Type 2 diabetes mellitus without complications: Secondary | ICD-10-CM | POA: Diagnosis not present

## 2017-11-27 DIAGNOSIS — K219 Gastro-esophageal reflux disease without esophagitis: Secondary | ICD-10-CM | POA: Diagnosis not present

## 2017-11-27 DIAGNOSIS — K5900 Constipation, unspecified: Secondary | ICD-10-CM | POA: Diagnosis not present

## 2017-11-27 DIAGNOSIS — J45909 Unspecified asthma, uncomplicated: Secondary | ICD-10-CM | POA: Diagnosis not present

## 2017-11-27 DIAGNOSIS — R69 Illness, unspecified: Secondary | ICD-10-CM | POA: Diagnosis not present

## 2017-11-27 DIAGNOSIS — I10 Essential (primary) hypertension: Secondary | ICD-10-CM | POA: Diagnosis not present

## 2017-11-27 DIAGNOSIS — E669 Obesity, unspecified: Secondary | ICD-10-CM | POA: Diagnosis not present

## 2017-11-27 DIAGNOSIS — G3184 Mild cognitive impairment, so stated: Secondary | ICD-10-CM | POA: Diagnosis not present

## 2017-12-31 DIAGNOSIS — L292 Pruritus vulvae: Secondary | ICD-10-CM | POA: Diagnosis not present

## 2017-12-31 DIAGNOSIS — Z01411 Encounter for gynecological examination (general) (routine) with abnormal findings: Secondary | ICD-10-CM | POA: Diagnosis not present

## 2018-01-04 ENCOUNTER — Other Ambulatory Visit: Payer: Self-pay | Admitting: Obstetrics and Gynecology

## 2018-01-04 DIAGNOSIS — N904 Leukoplakia of vulva: Secondary | ICD-10-CM | POA: Diagnosis not present

## 2018-01-04 DIAGNOSIS — R1030 Lower abdominal pain, unspecified: Secondary | ICD-10-CM | POA: Diagnosis not present

## 2018-01-04 DIAGNOSIS — L900 Lichen sclerosus et atrophicus: Secondary | ICD-10-CM | POA: Diagnosis not present

## 2018-01-10 DIAGNOSIS — R1030 Lower abdominal pain, unspecified: Secondary | ICD-10-CM | POA: Diagnosis not present

## 2018-01-10 DIAGNOSIS — E119 Type 2 diabetes mellitus without complications: Secondary | ICD-10-CM | POA: Diagnosis not present

## 2018-02-21 DIAGNOSIS — L900 Lichen sclerosus et atrophicus: Secondary | ICD-10-CM | POA: Diagnosis not present

## 2018-03-26 DIAGNOSIS — I10 Essential (primary) hypertension: Secondary | ICD-10-CM | POA: Diagnosis not present

## 2018-03-26 DIAGNOSIS — C50912 Malignant neoplasm of unspecified site of left female breast: Secondary | ICD-10-CM | POA: Diagnosis not present

## 2018-03-26 DIAGNOSIS — K219 Gastro-esophageal reflux disease without esophagitis: Secondary | ICD-10-CM | POA: Diagnosis not present

## 2018-03-26 DIAGNOSIS — Z23 Encounter for immunization: Secondary | ICD-10-CM | POA: Diagnosis not present

## 2018-03-26 DIAGNOSIS — R69 Illness, unspecified: Secondary | ICD-10-CM | POA: Diagnosis not present

## 2018-03-26 DIAGNOSIS — E1169 Type 2 diabetes mellitus with other specified complication: Secondary | ICD-10-CM | POA: Diagnosis not present

## 2019-08-24 ENCOUNTER — Ambulatory Visit: Payer: Medicare Other | Attending: Internal Medicine

## 2019-08-24 DIAGNOSIS — Z23 Encounter for immunization: Secondary | ICD-10-CM | POA: Insufficient documentation

## 2019-08-24 NOTE — Progress Notes (Signed)
   Covid-19 Vaccination Clinic  Name:  Morgan Carrillo    MRN: [id number] DOB: 08-18-1950  08/24/2019  [name] was observed post Covid-19 immunization for 30 minutes based on pre-vaccination screening without incidence. She was provided with Vaccine Information Sheet and instruction to access the V-Safe system.   [name] was instructed to call 911 with any severe reactions post vaccine: Marland Kitchen Difficulty breathing  . Swelling of your face and throat  . A fast heartbeat  . A bad rash all over your body  . Dizziness and weakness    Immunizations Administered    Name Date Dose VIS Date Route   Pfizer COVID-19 Vaccine 08/24/2019  1:35 PM 0.3 mL 06/06/2019 Intramuscular   Manufacturer: [place]   Lot: [id number]   [place]: [id number]

## 2019-09-23 ENCOUNTER — Ambulatory Visit: Payer: Medicare Other | Attending: Internal Medicine

## 2019-09-23 DIAGNOSIS — Z23 Encounter for immunization: Secondary | ICD-10-CM

## 2019-09-23 NOTE — Progress Notes (Signed)
   Covid-19 Vaccination Clinic  Name:  Morgan Carrillo    MRN: [id number] DOB: 1951-04-22  09/23/2019  Morgan Carrillo was observed post Covid-19 immunization for 15 minutes without incident. She was provided with Vaccine Information Sheet and instruction to access the V-Safe system.   Morgan Carrillo was instructed to call 911 with any severe reactions post vaccine: Marland Kitchen Difficulty breathing  . Swelling of face and throat  . A fast heartbeat  . A bad rash all over body  . Dizziness and weakness   Immunizations Administered    Name Date Dose VIS Date Route   Pfizer COVID-19 Vaccine 09/23/2019 12:23 PM 0.3 mL 06/06/2019 Intramuscular   Manufacturer: [place]   Lot: [id number]   [place]: [id number]

## 2020-10-12 ENCOUNTER — Other Ambulatory Visit: Payer: Self-pay | Admitting: Internal Medicine

## 2020-10-12 ENCOUNTER — Ambulatory Visit
Admission: RE | Admit: 2020-10-12 | Discharge: 2020-10-12 | Disposition: A | Discharge status: Home / Self Care | Payer: Medicare Other | Source: Ambulatory Visit | Attending: Internal Medicine | Admitting: Internal Medicine

## 2020-10-12 DIAGNOSIS — M25561 Pain in right knee: Secondary | ICD-10-CM

## 2020-10-21 ENCOUNTER — Other Ambulatory Visit: Payer: Self-pay

## 2020-10-21 ENCOUNTER — Encounter: Payer: Self-pay | Admitting: Physician Assistant

## 2020-10-21 ENCOUNTER — Ambulatory Visit (INDEPENDENT_AMBULATORY_CARE_PROVIDER_SITE_OTHER): Payer: Medicare Other | Admitting: Physician Assistant

## 2020-10-21 ENCOUNTER — Ambulatory Visit: Payer: Self-pay

## 2020-10-21 VITALS — Ht 63.0 in | Wt 179.6 lb

## 2020-10-21 DIAGNOSIS — M25562 Pain in left knee: Secondary | ICD-10-CM | POA: Diagnosis not present

## 2020-10-21 DIAGNOSIS — M1711 Unilateral primary osteoarthritis, right knee: Secondary | ICD-10-CM | POA: Diagnosis not present

## 2020-10-21 MED ORDER — METHYLPREDNISOLONE ACETATE 40 MG/ML IJ SUSP
40.0000 mg | INTRAMUSCULAR | Status: AC | PRN
  Administered 2020-10-21: 40 mg via INTRA_ARTICULAR

## 2020-10-21 MED ORDER — LIDOCAINE HCL 1 % IJ SOLN
3.0000 mL | INTRAMUSCULAR | Status: AC | PRN
  Administered 2020-10-21: 3 mL

## 2020-10-21 NOTE — Progress Notes (Signed)
Office Visit Note   Patient: Morgan Carrillo           Date of Birth: 11/25/1950           MRN: [id number] Visit Date: 10/21/2020              Requested by: Wenda Low, MD [address] [place],  [place] [address] PCP: Wenda Low, MD   Assessment & Plan: Visit Diagnoses:  1. Left knee pain, unspecified chronicity   2. Unilateral primary osteoarthritis, right knee     Plan: She shown quad strengthening exercises.  We will see her back in 2 weeks see what type of response she had to the injection right knee today.  Questions were encouraged and answered at length.  Reassurance was given that the left total knee replacement appeared well fixed on the radiographs today.  Follow-Up Instructions: Return in about 2 weeks (around 11/04/2020).   Orders:  Orders Placed This Encounter  Procedures  . XR Knee 1-2 Views Left   No orders of the defined types were placed in this encounter.     Procedures: Large Joint Inj: R knee on 10/21/2020 5:29 PM Indications: pain Details: 22 G 1.5 in needle, anterolateral approach  Arthrogram: No  Medications: 3 mL lidocaine 1 %; 40 mg methylPREDNISolone acetate 40 MG/ML Aspirate: 11 mL yellow Outcome: tolerated well, no immediate complications Procedure, treatment alternatives, risks and benefits explained, specific risks discussed. Consent was given by the patient. Immediately prior to procedure a time out was called to verify the correct patient, procedure, equipment, support staff and site/side marked as required. Patient was prepped and draped in the usual sterile fashion.       Clinical Data: No additional findings.   Subjective: Chief Complaint  Patient presents with  . Right Knee - Pain    HPI Patient is a 70 year old female were seen today for bilateral knee pain.  She has fell twice during the last week of March both of these were mechanical falls and did not involve any loss of consciousness.  One was  because her cousin fell backwards on her and caused her to fall.  Since then she has had pain in both knees.  She has a history of a left total knee arthroplasty performed at [facility] in 2016 and is done well until the recent falls.  She is mostly having increased pain in the right knee.  She notes weakness of the right knee.  She is taking Tylenol for the pain using heat pad.  She is diabetic but reports good control and she checks her glucose levels daily. Right knee radiographs dated 10/12/2020 are reviewed and shows tricompartmental arthritic changes with moderate medial compartmental narrowing mild lateral compartmental narrowing and moderate patellofemoral changes.  No subluxation dislocation.  No acute fractures.  Review of Systems Negative for fevers or chills.  Objective: Vital Signs: Ht 5\' 3"  (1.6 m)   Wt 179 lb 9.6 oz (81.5 kg)   BMI 31.81 kg/m   Physical Exam Constitutional:      Appearance: She is not ill-appearing or diaphoretic.  Pulmonary:     Effort: Pulmonary effort is normal.  Neurological:     Mental Status: She is alert and oriented to person, place, and time.  Psychiatric:        Mood and Affect: Mood normal.     Ortho Exam Left knee full range of motion without pain.  No instability valgus varus stressing.  She has tenderness  over the patella tibial tendon r left knee.  Able do straight leg raise.  No abnormal warmth erythema or effusion left knee. Right knee good range of motion.  Significant patellofemoral crepitus with range of motion.  No gross instability valgus varus stressing.  Tenderness along the lateral joint line.  No tenderness along medial joint line.  No abnormal warmth or erythema.  Slight effusion. Specialty Comments:  No specialty comments available.  Imaging: XR Knee 1-2 Views Left  Result Date: 10/21/2020 Left knee 2 views: Knee is well located.  No acute fractures.  Status post left total knee arthroplasty with well-seated components.  No bony  abnormalities otherwise.    PMFS History: There are no problems to display for this patient.  Past Medical History:  Diagnosis Date  . Abdominal bloating    with pain in abdomen  . Anxiety   . Arthritis    DDD -all joints  . Cancer [facility])    Left breast '00- "lumpectomy" , radiation  . Depression   . Fatty liver   . GERD (gastroesophageal reflux disease)   . Hypertension   . Rapid heart beat    usually when anxious or upset  . Seasonal allergies   . Stroke [facility])    mild age 69 - no residual    History reviewed. No pertinent family history.  Past Surgical History:  Procedure Laterality Date  . ABDOMINAL HYSTERECTOMY     with left SO  . BREAST SURGERY Left    Lumpectomy" cancer-mild"  . COLONOSCOPY WITH PROPOFOL N/A 11/16/2015   Procedure: COLONOSCOPY WITH PROPOFOL;  Surgeon: Garlan Fair, MD;  Location: [facility];  Service: Endoscopy;  Laterality: N/A;  . JOINT REPLACEMENT Left    2014-'15 -[place]    . TUBAL LIGATION     Social History   Occupational History  . Not on file  Tobacco Use  . Smoking status: Never Smoker  . Smokeless tobacco: Never Used  Substance and Sexual Activity  . Alcohol use: No    Comment: rare -wine  . Drug use: No  . Sexual activity: Not on file

## 2020-11-04 ENCOUNTER — Encounter: Payer: Self-pay | Admitting: Physician Assistant

## 2020-11-04 ENCOUNTER — Ambulatory Visit (INDEPENDENT_AMBULATORY_CARE_PROVIDER_SITE_OTHER): Payer: Medicare Other | Admitting: Physician Assistant

## 2020-11-04 DIAGNOSIS — M1711 Unilateral primary osteoarthritis, right knee: Secondary | ICD-10-CM

## 2020-11-04 DIAGNOSIS — M25562 Pain in left knee: Secondary | ICD-10-CM

## 2020-11-04 DIAGNOSIS — Z96652 Presence of left artificial knee joint: Secondary | ICD-10-CM

## 2020-11-04 MED ORDER — LIDOCAINE HCL 1 % IJ SOLN
3.0000 mL | INTRAMUSCULAR | Status: AC | PRN
  Administered 2020-11-04: 3 mL

## 2020-11-04 NOTE — Progress Notes (Signed)
Office Visit Note   Patient: Morgan Carrillo           Date of Birth: 1950/11/18           MRN: [id number] Visit Date: 11/04/2020              Requested by: Wenda Low, MD [address] [place],  [place] [address] PCP: Wenda Low, MD   Assessment & Plan: Visit Diagnoses:  1. Left knee pain, unspecified chronicity   2. Unilateral primary osteoarthritis, right knee   3. Status post total left knee replacement     Plan: We will try to gain approval for supplemental injection in the right knee she like to avoid surgery if possible.  In regards to the left knee Ace bandage was applied she will remove his this evening before going to bed.  We will see how the knee does with this conservative treatment which consisted of quad strengthening and relative rest.  Per her request she was given a hinged knee brace right knee.  Follow-Up Instructions: Return for Supplemental injection.   Orders:  Orders Placed This Encounter  Procedures  . Large Joint Inj   No orders of the defined types were placed in this encounter.     Procedures: Large Joint Inj: L knee on 11/04/2020 5:19 PM Indications: pain Details: 22 G 1.5 in needle, anterolateral approach  Arthrogram: No  Medications: 3 mL lidocaine 1 % Aspirate: 30 mL yellow Outcome: tolerated well, no immediate complications Procedure, treatment alternatives, risks and benefits explained, specific risks discussed. Consent was given by the patient. Immediately prior to procedure a time out was called to verify the correct patient, procedure, equipment, support staff and site/side marked as required. Patient was prepped and draped in the usual sterile fashion.       Clinical Data: No additional findings.   Subjective: Chief Complaint  Patient presents with  . Right Knee - Pain    HPI Mrs. [place] returns today follow-up status post right knee injection.  She states the injection given on 10/21/2020 was no  real relief.  She still having pain in the knee gives way and feels as if the kneecap pops out at times.  She is still having pain also in the left knee which she has had a total knee replacement on in 2016 at [facility].  She has had no new injuries.  Her right knee has tricompartmental arthritic changes which are mild to moderate.  Review of Systems Negative for fevers or chills.  Objective: Vital Signs: There were no vitals taken for this visit.  Physical Exam Constitutional:      Appearance: She is not ill-appearing or diaphoretic.  Pulmonary:     Effort: Pulmonary effort is normal.  Neurological:     Mental Status: She is alert and oriented to person, place, and time.  Psychiatric:        Mood and Affect: Mood normal.     Ortho Exam Right knee: No significant effusion.  No erythema or ecchymosis.  She has tenderness along the lateral joint line.  No gross instability valgus varus stressing. Left knee surgical incisions well-healed.  No instability valgus varus stressing.  Slight effusion.  No abnormal warmth erythema.  Both knees with overall good range of motion. Specialty Comments:  No specialty comments available.  Imaging: No results found.   PMFS History: There are no problems to display for this patient.  Past Medical History:  Diagnosis Date  .  Abdominal bloating    with pain in abdomen  . Anxiety   . Arthritis    DDD -all joints  . Cancer [facility])    Left breast '00- "lumpectomy" , radiation  . Depression   . Fatty liver   . GERD (gastroesophageal reflux disease)   . Hypertension   . Rapid heart beat    usually when anxious or upset  . Seasonal allergies   . Stroke [facility])    mild age 56 - no residual    History reviewed. No pertinent family history.  Past Surgical History:  Procedure Laterality Date  . ABDOMINAL HYSTERECTOMY     with left SO  . BREAST SURGERY Left    Lumpectomy" cancer-mild"  . COLONOSCOPY WITH PROPOFOL N/A 11/16/2015   Procedure:  COLONOSCOPY WITH PROPOFOL;  Surgeon: Garlan Fair, MD;  Location: [facility];  Service: Endoscopy;  Laterality: N/A;  . JOINT REPLACEMENT Left    2014-'15 -[place]    . TUBAL LIGATION     Social History   Occupational History  . Not on file  Tobacco Use  . Smoking status: Never Smoker  . Smokeless tobacco: Never Used  Substance and Sexual Activity  . Alcohol use: No    Comment: rare -wine  . Drug use: No  . Sexual activity: Not on file

## 2021-02-22 ENCOUNTER — Emergency Department (HOSPITAL_COMMUNITY): Payer: Medicare Other

## 2021-02-22 ENCOUNTER — Other Ambulatory Visit: Payer: Self-pay

## 2021-02-22 ENCOUNTER — Encounter (HOSPITAL_COMMUNITY): Payer: Self-pay

## 2021-02-22 ENCOUNTER — Emergency Department (HOSPITAL_COMMUNITY)
Admission: EM | Admit: 2021-02-22 | Discharge: 2021-02-22 | Disposition: A | Discharge status: Home / Self Care | Payer: Medicare Other | Attending: Emergency Medicine | Admitting: Emergency Medicine

## 2021-02-22 DIAGNOSIS — M546 Pain in thoracic spine: Secondary | ICD-10-CM | POA: Diagnosis present

## 2021-02-22 DIAGNOSIS — Z96652 Presence of left artificial knee joint: Secondary | ICD-10-CM | POA: Diagnosis not present

## 2021-02-22 DIAGNOSIS — Z853 Personal history of malignant neoplasm of breast: Secondary | ICD-10-CM | POA: Diagnosis not present

## 2021-02-22 DIAGNOSIS — Z7982 Long term (current) use of aspirin: Secondary | ICD-10-CM | POA: Diagnosis not present

## 2021-02-22 DIAGNOSIS — Z79899 Other long term (current) drug therapy: Secondary | ICD-10-CM | POA: Diagnosis not present

## 2021-02-22 DIAGNOSIS — Z7984 Long term (current) use of oral hypoglycemic drugs: Secondary | ICD-10-CM | POA: Insufficient documentation

## 2021-02-22 DIAGNOSIS — L292 Pruritus vulvae: Secondary | ICD-10-CM | POA: Insufficient documentation

## 2021-02-22 DIAGNOSIS — I10 Essential (primary) hypertension: Secondary | ICD-10-CM | POA: Diagnosis not present

## 2021-02-22 DIAGNOSIS — R109 Unspecified abdominal pain: Secondary | ICD-10-CM | POA: Diagnosis not present

## 2021-02-22 DIAGNOSIS — E119 Type 2 diabetes mellitus without complications: Secondary | ICD-10-CM | POA: Insufficient documentation

## 2021-02-22 LAB — COMPREHENSIVE METABOLIC PANEL
ALT: 13 U/L (ref 0–44)
AST: 16 U/L (ref 15–41)
Albumin: 3.8 g/dL (ref 3.5–5.0)
Alkaline Phosphatase: 108 U/L (ref 38–126)
Anion gap: 8 (ref 5–15)
BUN: 13 mg/dL (ref 8–23)
CO2: 30 mmol/L (ref 22–32)
Calcium: 9.4 mg/dL (ref 8.9–10.3)
Chloride: 104 mmol/L (ref 98–111)
Creatinine, Ser: 0.68 mg/dL (ref 0.44–1.00)
GFR, Estimated: >60 mL/min (ref >60)
Glucose, Bld: 109 mg/dL — ABNORMAL HIGH (ref 70–99)
Potassium: 3.5 mmol/L (ref 3.5–5.1)
Sodium: 142 mmol/L (ref 135–145)
Total Bilirubin: 0.3 mg/dL (ref 0.3–1.2)
Total Protein: 8.3 g/dL — ABNORMAL HIGH (ref 6.5–8.1)

## 2021-02-22 LAB — URINALYSIS, ROUTINE W REFLEX MICROSCOPIC
Bacteria, UA: NONE SEEN
Bilirubin Urine: NEGATIVE
Glucose, UA: >=500 mg/dL — AB
Hgb urine dipstick: NEGATIVE
Ketones, ur: NEGATIVE mg/dL
Leukocytes,Ua: NEGATIVE
Nitrite: NEGATIVE
Protein, ur: NEGATIVE mg/dL
Specific Gravity, Urine: 1.033 — ABNORMAL HIGH (ref 1.005–1.030)
pH: 5 (ref 5.0–8.0)

## 2021-02-22 LAB — CBC WITH DIFFERENTIAL/PLATELET
Abs Immature Granulocytes: 0.02 10*3/uL (ref 0.00–0.07)
Basophils Absolute: 0 10*3/uL (ref 0.0–0.1)
Basophils Relative: 0 %
Eosinophils Absolute: 0.1 10*3/uL (ref 0.0–0.5)
Eosinophils Relative: 1 %
HCT: 40.6 % (ref 36.0–46.0)
Hemoglobin: 12.1 g/dL (ref 12.0–15.0)
Immature Granulocytes: 0 %
Lymphocytes Relative: 39 %
Lymphs Abs: 2.9 10*3/uL (ref 0.7–4.0)
MCH: 23.8 pg — ABNORMAL LOW (ref 26.0–34.0)
MCHC: 29.8 g/dL — ABNORMAL LOW (ref 30.0–36.0)
MCV: 79.8 fL — ABNORMAL LOW (ref 80.0–100.0)
Monocytes Absolute: 0.7 10*3/uL (ref 0.1–1.0)
Monocytes Relative: 9 %
Neutro Abs: 3.7 10*3/uL (ref 1.7–7.7)
Neutrophils Relative %: 51 %
Platelets: 303 10*3/uL (ref 150–400)
RBC: 5.09 MIL/uL (ref 3.87–5.11)
RDW: 17.5 % — ABNORMAL HIGH (ref 11.5–15.5)
WBC: 7.4 10*3/uL (ref 4.0–10.5)
nRBC: 0 % (ref 0.0–0.2)

## 2021-02-22 MED ORDER — ACETAMINOPHEN 500 MG PO TABS
1000.0000 mg | ORAL_TABLET | Freq: Once | ORAL | Status: AC
  Administered 2021-02-22: 1000 mg via ORAL
  Filled 2021-02-22: qty 2

## 2021-02-22 MED ORDER — OXYCODONE-ACETAMINOPHEN 5-325 MG PO TABS
1.0000 | ORAL_TABLET | Freq: Once | ORAL | Status: AC
  Administered 2021-02-22: 1 via ORAL
  Filled 2021-02-22: qty 1

## 2021-02-22 MED ORDER — METHOCARBAMOL 500 MG PO TABS: 500.0000 mg | ORAL_TABLET | Freq: Two times a day (BID) | ORAL | 0 refills | Status: AC

## 2021-02-22 NOTE — ED Provider Notes (Signed)
[facility] DEPT Provider Note   CSN: [id number] Arrival date & time: 02/22/21  1809     History Chief Complaint  Patient presents with   Back Pain    Morgan Carrillo is a 70 y.o. female with a PMH of DDD, DM2 who presents with one day of colicky left thoracic back pain. Patient reports the pain is sharp, stabbing, causes her to cry out in pain. She denies recent injury or strain to her back, denies fever, IVDU, chronic corticosteroid use, pain that wake her up from sleep, history of cancer. Patient denies dysuria, urinary frequency, hematuria. She does not have a history of kidney stones. She does endorse some ongoing vaginal itching secondary to atrophic vaginitis.    Back Pain Associated symptoms: no dysuria       Past Medical History:  Diagnosis Date   Abdominal bloating    with pain in abdomen   Anxiety    Arthritis    DDD -all joints   Cancer ([place])    Left breast '00- "lumpectomy" , radiation   Depression    Fatty liver    GERD (gastroesophageal reflux disease)    Hypertension    Rapid heart beat    usually when anxious or upset   Seasonal allergies    Stroke [facility])    mild age 3 - no residual    There are no problems to display for this patient.   Past Surgical History:  Procedure Laterality Date   ABDOMINAL HYSTERECTOMY     with left SO   BREAST SURGERY Left    Lumpectomy" cancer-mild"   COLONOSCOPY WITH PROPOFOL N/A 11/16/2015   Procedure: COLONOSCOPY WITH PROPOFOL;  Surgeon: Garlan Fair, MD;  Location: [facility] ENDOSCOPY;  Service: Endoscopy;  Laterality: N/A;   JOINT REPLACEMENT Left    2014-'15 -[place]       OB History   No obstetric history on file.     No family history on file.  Social History   Tobacco Use   Smoking status: Never   Smokeless tobacco: Never  Substance Use Topics   Alcohol use: No    Comment: rare -wine   Drug use: No    Home  Medications Prior to Admission medications   Medication Sig Start Date End Date Taking? Authorizing Provider  methocarbamol (ROBAXIN) 500 MG tablet Take 1 tablet (500 mg total) by mouth 2 (two) times daily. 02/22/21  Yes Dorlene Footman H, PA-C  acetaminophen (TYLENOL) 325 MG tablet Take by mouth every 6 (six) hours as needed for headache.    [provider]  albuterol (VENTOLIN HFA) 108 (90 Base) MCG/ACT inhaler INHALE TWO PUFFS FOUR TIMES DAILY    [provider]  ALPRAZolam (XANAX) 1 MG tablet Take 0.5-1 mg by mouth at bedtime as needed for anxiety or sleep.  09/13/15   [provider]  amLODipine (NORVASC) 5 MG tablet TAKE ONE TABLET BY MOUTH EVERYDAY AT BEDTIME    [provider]  aspirin 81 MG chewable tablet Chew by mouth.    [provider]  buPROPion (WELLBUTRIN SR) 150 MG 12 hr tablet Take 150 mg by mouth daily.    [provider]  butalbital-acetaminophen-caffeine (FIORICET) 50-325-40 MG tablet 1 tablet as needed    [provider]  citalopram (CELEXA) 20 MG tablet Take 20 mg by mouth 2 (two) times daily. 08/07/15   [provider]  conjugated estrogens (  PREMARIN) vaginal cream Place vaginally. 10/23/16   [provider]  [employer] ALLERGY 25 MG capsule Take 25 mg by mouth every 6 (six) hours as needed for itching or allergies.  08/04/15   [provider]  fluticasone (FLONASE) 50 MCG/ACT nasal spray Place 2 sprays into both nostrils daily as needed for allergies.  09/17/15   [provider]  ibuprofen (ADVIL) 200 MG tablet 2 tablets with food or milk as needed    [provider]  JARDIANCE 10 MG TABS tablet Take 10 mg by mouth every morning. 06/30/20   [provider]  levocetirizine (XYZAL) 5 MG tablet 1 tablet in the evening 07/23/16   [provider]  lisinopril-hydrochlorothiazide (PRINZIDE,ZESTORETIC) 20-25 MG per tablet Take 1 tablet by mouth 2 (two) times daily.      [provider]  meclizine (ANTIVERT) 25 MG tablet TAKE ONE TABLET BY MOUTH three times daily AS NEEDED FOR DIZZINESS    [provider]  metoprolol (LOPRESSOR) 50 MG tablet Take 50 mg by mouth daily. 08/03/15   [provider]  omeprazole (PRILOSEC) 40 MG capsule Take 40 mg by mouth daily. 10/06/14   [provider]  polyethylene glycol-electrolytes (NULYTELY) 420 g solution See admin instructions. 09/20/20   [provider]  polyvinyl alcohol (LIQUIFILM TEARS) 1.4 % ophthalmic solution Place 1 drop into both eyes as needed for dry eyes.    [provider]  potassium chloride (KLOR-CON) 10 MEQ tablet TAKE ONE TABLET BY MOUTH EVERYDAY AT BEDTIME    [provider]  sertraline (ZOLOFT) 100 MG tablet Take 1 tablet by mouth every morning.    [provider]  simvastatin (ZOCOR) 10 MG tablet 1 tablet in the evening    [provider]  triamcinolone cream (KENALOG) 0.1 % APPLY TO THE AFFECTED AREA(S) EXTERNALLY TWICE DAILY 07/20/16   [provider]    Allergies    Codeine, Shellfish allergy, Sulfa antibiotics, Tramadol, and Penicillins  Review of Systems   Review of Systems  Genitourinary:  Positive for flank pain. Negative for dysuria, hematuria and urgency.  Musculoskeletal:  Positive for back pain.  All other systems reviewed and are negative.  Physical Exam Updated Vital Signs BP (!) 153/81   Pulse 66   Temp 98.2 F (36.8 C) (Oral)   Resp 18   Ht '5\' 3"'$  (1.6 m)   Wt 77.1 kg   SpO2 100%   BMI 30.11 kg/m   Physical Exam Vitals and nursing note reviewed.  Constitutional:      General: She is not in acute distress.    Appearance: Normal appearance.  HENT:     Head: Normocephalic and atraumatic.  Eyes:     General:        Right eye: No discharge.        Left eye: No discharge.  Cardiovascular:     Rate and Rhythm: Normal rate and regular rhythm.     Heart sounds: No murmur heard.   No  friction rub. No gallop.  Pulmonary:     Effort: Pulmonary effort is normal.     Breath sounds: Normal breath sounds.  Abdominal:     General: Bowel sounds are normal.     Palpations: Abdomen is soft.     Comments: Left CVA tenderness. No suprapubic tenderness.  Musculoskeletal:        General: No deformity.     Comments: No midline spinal tenderness  Skin:    General: Skin is warm  and dry.     Capillary Refill: Capillary refill takes less than 2 seconds.  Neurological:     Mental Status: She is alert and oriented to person, place, and time.     Sensory: No sensory deficit.     Motor: No weakness.     Gait: Gait normal.     Comments: Legs neurovascularly intact without gait instability, 5/5 strength, no numbness, sensory deficit. No saddle anesthesia.  Psychiatric:        Mood and Affect: Mood normal.        Behavior: Behavior normal.    ED Results / Procedures / Treatments   Labs (all labs ordered are listed, but only abnormal results are displayed) Labs Reviewed  URINALYSIS, ROUTINE W REFLEX MICROSCOPIC - Abnormal; Notable for the following components:      Result Value   Specific Gravity, Urine 1.033 (*)    Glucose, UA >=500 (*)    All other components within normal limits  CBC WITH DIFFERENTIAL/PLATELET - Abnormal; Notable for the following components:   MCV 79.8 (*)    MCH 23.8 (*)    MCHC 29.8 (*)    RDW 17.5 (*)    All other components within normal limits  COMPREHENSIVE METABOLIC PANEL - Abnormal; Notable for the following components:   Glucose, Bld 109 (*)    Total Protein 8.3 (*)    All other components within normal limits    EKG None  Radiology CT Renal Stone Study  Result Date: 02/22/2021 CLINICAL DATA:  LEFT flank pain in a 70 year old female. EXAM: CT ABDOMEN AND PELVIS WITHOUT CONTRAST TECHNIQUE: Multidetector CT imaging of the abdomen and pelvis was performed following the standard protocol without IV contrast. COMPARISON:  Comparison made with  July 24, 2016. FINDINGS: Lower chest: Incidental imaging of the lung bases without effusion or sign of consolidation. Minimal atelectasis. Hepatobiliary: Low-density hepatic lesions compatible with cysts are unchanged dating back to 2018. Liver with otherwise smooth contours and without signs of pericholecystic stranding. No gross biliary duct dilation. Pancreas: No contour abnormality or signs of inflammation. Fatty density in the head of the pancreas compatible with lipoma or invagination of adjacent fat into pancreatic parenchyma. Spleen: Normal spleen. Adrenals/Urinary Tract: Adrenal glands are normal. Smooth contour the bilateral kidneys. No hydronephrosis. 2 mm calculus in the lower pole the RIGHT kidney. Otherwise negative for nephrolithiasis. No visible ureteral calculi. Hemorrhagic cyst arises from the lower pole of the LEFT kidney with a larger cyst arising from the upper pole the LEFT kidney measuring approximately 4.6 x 3.2 cm. Stomach/Bowel: No acute gastrointestinal process.  Normal appendix. Vascular/Lymphatic: Calcified atheromatous plaque of the abdominal aorta. No aneurysmal dilation. Smooth contour of the IVC. There is no gastrohepatic or hepatoduodenal ligament lymphadenopathy. No retroperitoneal or mesenteric lymphadenopathy. No pelvic sidewall lymphadenopathy. Reproductive: Small fat containing umbilical hernia. Other: Post hysterectomy without adnexal mass. Musculoskeletal: Small lipoma along the RIGHT flank in the serratus musculature. Spinal degenerative changes. No acute or destructive bone process. IMPRESSION: No acute findings in the abdomen or pelvis. Nephrolithiasis with tiny calculus on the RIGHT. Renal cysts on the LEFT. Low-density hepatic lesions compatible with cysts are unchanged dating back to 2018. Small fat containing umbilical hernia. Small lipoma along the RIGHT flank in the serratus musculature. Aortic Atherosclerosis (ICD10-I70.0). Electronically Signed   By: Zetta Bills M.D.   On: 02/22/2021 20:37    Procedures Procedures   Medications Ordered in ED Medications  oxyCODONE-acetaminophen (PERCOCET/ROXICET) 5-325 MG per tablet 1 tablet (has no  administration in time range)  acetaminophen (TYLENOL) tablet 1,000 mg (1,000 mg Oral Given 02/22/21 2014)    ED Course  I have reviewed the triage vital signs and the nursing notes.  Pertinent labs & imaging results that were available during my care of the patient were reviewed by me and considered in my medical decision making (see chart for details).    MDM Rules/Calculators/A&P                         Discussed possible flare of her DDD, even without inciting incident versus possibility for kidney stone given patient's description of colicky flank pain that makes her cry out in pain. No red flags for back pain including fever, midline spine tenderness, history of cancer, chronic corticosteroid use, IVDU. Neurovascularly intact below level of injury. No gait issue.  Discussed we will rule out kidney stone and treat supportively as uncomplicated acute on chronic back pain if CT and UA are unremarkable. CT reveals uncomplicated cyst of left kidney, small non obstructing stone in right kidney.   I personally reviewed all laboratory work and imaging.  Metabolic panel without any acute abnormality specifically kidney function within normal limits and no significant electrolyte abnormalities. CBC without leukocytosis or significant anemia.  Discussed likely a flare of her ongoing back pain, recommend muscle relaxant, rest, continued ibuprofen and tylenol. Extensive return precautions given. Patient understands and agrees with plan.  Final Clinical Impression(s) / ED Diagnoses Final diagnoses:  Acute left-sided thoracic back pain    Rx / DC Orders ED Discharge Orders          Ordered    methocarbamol (ROBAXIN) 500 MG tablet  2 times daily        02/22/21 2101             Dorien Chihuahua 02/22/21 2103    Davonna Belling, MD 02/23/21 [phone]

## 2021-02-22 NOTE — ED Triage Notes (Signed)
Pt c/o back pain in her thoracic area L>R. Pt denies known injury to area. No changes in urination. Denies SOB, CP.

## 2021-02-22 NOTE — Discharge Instructions (Addendum)
Follow up with orthopedics if your pain is not improving. Please use Tylenol or ibuprofen for pain.  You may use 600 mg ibuprofen every 6 hours or 1000 mg of Tylenol every 6 hours.  You may choose to alternate between the 2.  This would be most effective.  Not to exceed 4 g of Tylenol within 24 hours.  Not to exceed 3200 mg ibuprofen 24 hours.

## 2021-02-24 ENCOUNTER — Telehealth: Payer: Self-pay

## 2021-02-24 NOTE — Telephone Encounter (Signed)
Patient would like to be worked into the schedule?  Stated that she is having severe back pain and that the muscle relaxer is not helping.  CB# [phone].  Please advise.  Thank you.

## 2021-02-25 NOTE — Telephone Encounter (Signed)
Tried to call patient to apologize and let her know Dr. Ninfa Linden doesn't have any openings. She may see another provider or her PCP

## 2021-02-28 ENCOUNTER — Emergency Department (HOSPITAL_COMMUNITY)
Admission: EM | Admit: 2021-02-28 | Discharge: 2021-02-28 | Disposition: A | Discharge status: Home / Self Care | Payer: Medicare Other | Attending: Emergency Medicine | Admitting: Emergency Medicine

## 2021-02-28 ENCOUNTER — Encounter (HOSPITAL_COMMUNITY): Payer: Self-pay

## 2021-02-28 ENCOUNTER — Other Ambulatory Visit: Payer: Self-pay

## 2021-02-28 DIAGNOSIS — Z853 Personal history of malignant neoplasm of breast: Secondary | ICD-10-CM | POA: Insufficient documentation

## 2021-02-28 DIAGNOSIS — R109 Unspecified abdominal pain: Secondary | ICD-10-CM

## 2021-02-28 DIAGNOSIS — I10 Essential (primary) hypertension: Secondary | ICD-10-CM | POA: Diagnosis not present

## 2021-02-28 DIAGNOSIS — N2889 Other specified disorders of kidney and ureter: Secondary | ICD-10-CM | POA: Diagnosis not present

## 2021-02-28 DIAGNOSIS — N281 Cyst of kidney, acquired: Secondary | ICD-10-CM

## 2021-02-28 DIAGNOSIS — Z9012 Acquired absence of left breast and nipple: Secondary | ICD-10-CM | POA: Insufficient documentation

## 2021-02-28 DIAGNOSIS — Z96652 Presence of left artificial knee joint: Secondary | ICD-10-CM | POA: Insufficient documentation

## 2021-02-28 DIAGNOSIS — K219 Gastro-esophageal reflux disease without esophagitis: Secondary | ICD-10-CM | POA: Insufficient documentation

## 2021-02-28 LAB — URINALYSIS, ROUTINE W REFLEX MICROSCOPIC
Bilirubin Urine: NEGATIVE
Glucose, UA: >1000 mg/dL — AB
Hgb urine dipstick: NEGATIVE
Ketones, ur: NEGATIVE mg/dL
Leukocytes,Ua: NEGATIVE
Nitrite: NEGATIVE
Protein, ur: NEGATIVE mg/dL
Specific Gravity, Urine: 1.01 (ref 1.005–1.030)
pH: 7.5 (ref 5.0–8.0)

## 2021-02-28 MED ORDER — NAPROXEN 500 MG PO TABS
500.0000 mg | ORAL_TABLET | Freq: Once | ORAL | Status: AC
  Administered 2021-02-28: 500 mg via ORAL
  Filled 2021-02-28: qty 1

## 2021-02-28 MED ORDER — OXYCODONE HCL 5 MG PO TABS: 5.0000 mg | ORAL_TABLET | ORAL | 0 refills | Status: AC | PRN

## 2021-02-28 NOTE — ED Triage Notes (Signed)
Pt complains of left flank pain since Tuesday.

## 2021-02-28 NOTE — Discharge Instructions (Addendum)
You were evaluated in the Emergency Department and after careful evaluation, we did not find any emergent condition requiring admission or further testing in the hospital.  Your exam/testing today was overall reassuring.  Symptoms could be due to a muscle strain or spasm of your back.  They could also be due to to a cyst on your kidney.  Recommend follow-up with the urologists for further management.  Recommend Tylenol 1000 mg every 4-6 hours and/or Motrin 600 mg every 4-6 hours for pain.  You can use the oxycodone medication for more significant pain.  Please return to the Emergency Department if you experience any worsening of your condition.  Thank you for allowing [place] to be a part of your care.

## 2021-02-28 NOTE — ED Provider Notes (Signed)
[facility] Emergency Department Provider Note MRN:  [id number]  Arrival date & time: 02/28/21     Chief Complaint   Flank Pain   History of Present Illness   Morgan Carrillo is a 70 y.o. year-old female with a history of hypertension presenting to the ED with chief complaint of flank pain.  Location: Left flank Duration: 2 or 3 days Onset: Gradual Timing: Constant pain Description: Sharp Severity: Moderate Exacerbating/Alleviating Factors: Worse with certain positions Associated Symptoms: None Pertinent Negatives: Denies dysuria or hematuria, no numbness or weakness to the arms or legs, no bowel or bladder dysfunction, no abdominal pain, no fever, no chest pain or shortness of breath  Additional History: Was in the emergency department a few days ago with a negative work-up  Review of Systems  A complete 10 system review of systems was obtained and all systems are negative except as noted in the HPI and PMH.   Patient's Health History    Past Medical History:  Diagnosis Date   Abdominal bloating    with pain in abdomen   Anxiety    Arthritis    DDD -all joints   Cancer ([place])    Left breast '00- "lumpectomy" , radiation   Depression    Fatty liver    GERD (gastroesophageal reflux disease)    Hypertension    Rapid heart beat    usually when anxious or upset   Seasonal allergies    Stroke [facility])    mild age 71 - no residual    Past Surgical History:  Procedure Laterality Date   ABDOMINAL HYSTERECTOMY     with left SO   BREAST SURGERY Left    Lumpectomy" cancer-mild"   COLONOSCOPY WITH PROPOFOL N/A 11/16/2015   Procedure: COLONOSCOPY WITH PROPOFOL;  Surgeon: Garlan Fair, MD;  Location: [facility] ENDOSCOPY;  Service: Endoscopy;  Laterality: N/A;   JOINT REPLACEMENT Left    2014-'15 -[place]      History reviewed. No pertinent family history.  Social History   Socioeconomic History    Marital status: Legally Separated    Spouse name: Not on file   Number of children: Not on file   Years of education: Not on file   Highest education level: Not on file  Occupational History   Not on file  Tobacco Use   Smoking status: Never   Smokeless tobacco: Never  Substance and Sexual Activity   Alcohol use: No    Comment: rare -wine   Drug use: No   Sexual activity: Not on file  Other Topics Concern   Not on file  Social History Narrative   Not on file   Social Determinants of Health   Financial Resource Strain: Not on file  Food Insecurity: Not on file  Transportation Needs: Not on file  Physical Activity: Not on file  Stress: Not on file  Social Connections: Not on file  Intimate Partner Violence: Not on file     Physical Exam   Vitals:   02/28/21 0015 02/28/21 0357  BP: (!) 174/96 (!) 161/93  Pulse: 88 65  Resp: 18 18  Temp:    SpO2: 98% 98%    CONSTITUTIONAL: Well-appearing, NAD NEURO:  Alert and oriented x 3, no focal deficits EYES:  eyes equal and reactive ENT/NECK:  no LAD, no JVD CARDIO: Regular rate, well-perfused, normal S1 and S2 PULM:  CTAB no wheezing or rhonchi GI/GU:  normal bowel sounds, non-distended, tender to palpation to the left flank MSK/SPINE:  No gross deformities, no edema SKIN:  no rash, atraumatic PSYCH:  Appropriate speech and behavior  *Additional and/or pertinent findings included in MDM below  Diagnostic and Interventional Summary    EKG Interpretation  Date/Time:    Ventricular Rate:    PR Interval:    QRS Duration:   QT Interval:    QTC Calculation:   R Axis:     Text Interpretation:         Labs Reviewed  URINALYSIS, ROUTINE W REFLEX MICROSCOPIC - Abnormal; Notable for the following components:      Result Value   Color, Urine YELLOW (*)    APPearance CLEAR (*)    Glucose, UA >1,000 (*)    Bacteria, UA RARE (*)    All other components within normal limits    No orders to display    Medications   naproxen (NAPROSYN) tablet 500 mg (has no administration in time range)     Procedures  /  Critical Care Procedures  ED Course and Medical Decision Making  I have reviewed the triage vital signs, the nursing notes, and pertinent available records from the EMR.  Listed above are laboratory and imaging tests that I personally ordered, reviewed, and interpreted and then considered in my medical decision making (see below for details).  Considering MSK versus kidney stone, work-up a few days ago did not show kidney stone, she was told that this was MSK pain.  The CT scan does comment on a hemorrhagic cyst of the left kidney, and so potentially this could cause pain.  She is not terribly tender to the musculature or with twisting of the torso.  Vitals are normal, no worsening of condition, just not getting better.  No indication for further laboratory testing or repeated imaging.  Will obtain repeat urinalysis, provide better pain control at home, refer to urology.       Barth Kirks. Sedonia Small, [place] [email or web address]  Final Clinical Impressions(s) / ED Diagnoses     ICD-10-CM   1. Flank pain  R10.9     2. Hemorrhage of cyst of native kidney  N28.89    N28.1       ED Discharge Orders          Ordered    oxyCODONE (ROXICODONE) 5 MG immediate release tablet  Every 4 hours PRN        02/28/21 0433             Discharge Instructions Discussed with and Provided to Patient:     Discharge Instructions      You were evaluated in the Emergency Department and after careful evaluation, we did not find any emergent condition requiring admission or further testing in the hospital.  Your exam/testing today was overall reassuring.  Symptoms could be due to a muscle strain or spasm of your back.  They could also be due to to a cyst on your kidney.  Recommend follow-up with the urologists for further management.  Recommend Tylenol 1000  mg every 4-6 hours and/or Motrin 600 mg every 4-6 hours for pain.  You can use the oxycodone medication for more significant pain.  Please return to the Emergency Department if you experience any worsening of your condition.  Thank you for allowing [place] to be a part of your care.         Maudie Flakes, MD 02/28/21  0434  

## 2021-03-03 DIAGNOSIS — N281 Cyst of kidney, acquired: Secondary | ICD-10-CM | POA: Diagnosis not present

## 2021-03-03 DIAGNOSIS — R109 Unspecified abdominal pain: Secondary | ICD-10-CM | POA: Diagnosis not present

## 2021-03-03 DIAGNOSIS — Z853 Personal history of malignant neoplasm of breast: Secondary | ICD-10-CM | POA: Diagnosis not present

## 2021-03-03 DIAGNOSIS — Z23 Encounter for immunization: Secondary | ICD-10-CM | POA: Diagnosis not present

## 2021-03-03 DIAGNOSIS — I10 Essential (primary) hypertension: Secondary | ICD-10-CM | POA: Diagnosis not present

## 2021-03-03 DIAGNOSIS — E7800 Pure hypercholesterolemia, unspecified: Secondary | ICD-10-CM | POA: Diagnosis not present

## 2021-03-03 DIAGNOSIS — K5900 Constipation, unspecified: Secondary | ICD-10-CM | POA: Diagnosis not present

## 2021-03-03 DIAGNOSIS — E1169 Type 2 diabetes mellitus with other specified complication: Secondary | ICD-10-CM | POA: Diagnosis not present

## 2021-03-14 DIAGNOSIS — R310 Gross hematuria: Secondary | ICD-10-CM | POA: Diagnosis not present

## 2021-03-14 DIAGNOSIS — N281 Cyst of kidney, acquired: Secondary | ICD-10-CM | POA: Diagnosis not present

## 2021-03-14 DIAGNOSIS — N200 Calculus of kidney: Secondary | ICD-10-CM | POA: Diagnosis not present

## 2021-03-17 DIAGNOSIS — G4700 Insomnia, unspecified: Secondary | ICD-10-CM | POA: Diagnosis not present

## 2021-03-17 DIAGNOSIS — E1169 Type 2 diabetes mellitus with other specified complication: Secondary | ICD-10-CM | POA: Diagnosis not present

## 2021-03-17 DIAGNOSIS — E7800 Pure hypercholesterolemia, unspecified: Secondary | ICD-10-CM | POA: Diagnosis not present

## 2021-03-17 DIAGNOSIS — I10 Essential (primary) hypertension: Secondary | ICD-10-CM | POA: Diagnosis not present

## 2021-03-17 DIAGNOSIS — K219 Gastro-esophageal reflux disease without esophagitis: Secondary | ICD-10-CM | POA: Diagnosis not present

## 2021-03-25 DIAGNOSIS — I739 Peripheral vascular disease, unspecified: Secondary | ICD-10-CM | POA: Diagnosis not present

## 2021-03-25 DIAGNOSIS — N200 Calculus of kidney: Secondary | ICD-10-CM | POA: Diagnosis not present

## 2021-03-25 DIAGNOSIS — R310 Gross hematuria: Secondary | ICD-10-CM | POA: Diagnosis not present

## 2021-03-25 DIAGNOSIS — N281 Cyst of kidney, acquired: Secondary | ICD-10-CM | POA: Diagnosis not present

## 2021-04-07 DIAGNOSIS — R310 Gross hematuria: Secondary | ICD-10-CM | POA: Diagnosis not present

## 2021-04-17 DIAGNOSIS — E7800 Pure hypercholesterolemia, unspecified: Secondary | ICD-10-CM | POA: Diagnosis not present

## 2021-04-17 DIAGNOSIS — K219 Gastro-esophageal reflux disease without esophagitis: Secondary | ICD-10-CM | POA: Diagnosis not present

## 2021-04-17 DIAGNOSIS — I10 Essential (primary) hypertension: Secondary | ICD-10-CM | POA: Diagnosis not present

## 2021-04-17 DIAGNOSIS — G4700 Insomnia, unspecified: Secondary | ICD-10-CM | POA: Diagnosis not present

## 2021-04-17 DIAGNOSIS — E1169 Type 2 diabetes mellitus with other specified complication: Secondary | ICD-10-CM | POA: Diagnosis not present

## 2021-05-16 DIAGNOSIS — E7800 Pure hypercholesterolemia, unspecified: Secondary | ICD-10-CM | POA: Diagnosis not present

## 2021-05-16 DIAGNOSIS — I10 Essential (primary) hypertension: Secondary | ICD-10-CM | POA: Diagnosis not present

## 2021-05-16 DIAGNOSIS — E1169 Type 2 diabetes mellitus with other specified complication: Secondary | ICD-10-CM | POA: Diagnosis not present

## 2021-05-16 DIAGNOSIS — G4700 Insomnia, unspecified: Secondary | ICD-10-CM | POA: Diagnosis not present

## 2021-05-16 DIAGNOSIS — K219 Gastro-esophageal reflux disease without esophagitis: Secondary | ICD-10-CM | POA: Diagnosis not present

## 2021-06-08 DIAGNOSIS — R0989 Other specified symptoms and signs involving the circulatory and respiratory systems: Secondary | ICD-10-CM | POA: Diagnosis not present

## 2021-06-08 DIAGNOSIS — U071 COVID-19: Secondary | ICD-10-CM | POA: Diagnosis not present

## 2021-06-15 DIAGNOSIS — Z853 Personal history of malignant neoplasm of breast: Secondary | ICD-10-CM | POA: Diagnosis not present

## 2021-06-15 DIAGNOSIS — M545 Low back pain, unspecified: Secondary | ICD-10-CM | POA: Diagnosis not present

## 2021-06-15 DIAGNOSIS — N200 Calculus of kidney: Secondary | ICD-10-CM | POA: Diagnosis not present

## 2021-06-15 DIAGNOSIS — N281 Cyst of kidney, acquired: Secondary | ICD-10-CM | POA: Diagnosis not present

## 2021-06-15 DIAGNOSIS — K769 Liver disease, unspecified: Secondary | ICD-10-CM | POA: Diagnosis not present

## 2021-06-16 DIAGNOSIS — E1169 Type 2 diabetes mellitus with other specified complication: Secondary | ICD-10-CM | POA: Diagnosis not present

## 2021-06-16 DIAGNOSIS — K219 Gastro-esophageal reflux disease without esophagitis: Secondary | ICD-10-CM | POA: Diagnosis not present

## 2021-06-16 DIAGNOSIS — I10 Essential (primary) hypertension: Secondary | ICD-10-CM | POA: Diagnosis not present

## 2021-06-16 DIAGNOSIS — G4700 Insomnia, unspecified: Secondary | ICD-10-CM | POA: Diagnosis not present

## 2021-06-16 DIAGNOSIS — E7800 Pure hypercholesterolemia, unspecified: Secondary | ICD-10-CM | POA: Diagnosis not present

## 2021-07-14 DIAGNOSIS — I10 Essential (primary) hypertension: Secondary | ICD-10-CM | POA: Diagnosis not present

## 2021-07-14 DIAGNOSIS — E7800 Pure hypercholesterolemia, unspecified: Secondary | ICD-10-CM | POA: Diagnosis not present

## 2021-07-14 DIAGNOSIS — G4700 Insomnia, unspecified: Secondary | ICD-10-CM | POA: Diagnosis not present

## 2021-07-14 DIAGNOSIS — E1169 Type 2 diabetes mellitus with other specified complication: Secondary | ICD-10-CM | POA: Diagnosis not present

## 2021-07-14 DIAGNOSIS — K219 Gastro-esophageal reflux disease without esophagitis: Secondary | ICD-10-CM | POA: Diagnosis not present

## 2021-08-01 IMAGING — CR DG KNEE 1-2V*R*
2 series · 2 of 2 positions shown · non-contrast
Comparison: No recent.

CLINICAL DATA: Right anterior knee pain.

EXAM:
RIGHT KNEE - 1-2 VIEW

[t knee ap right]
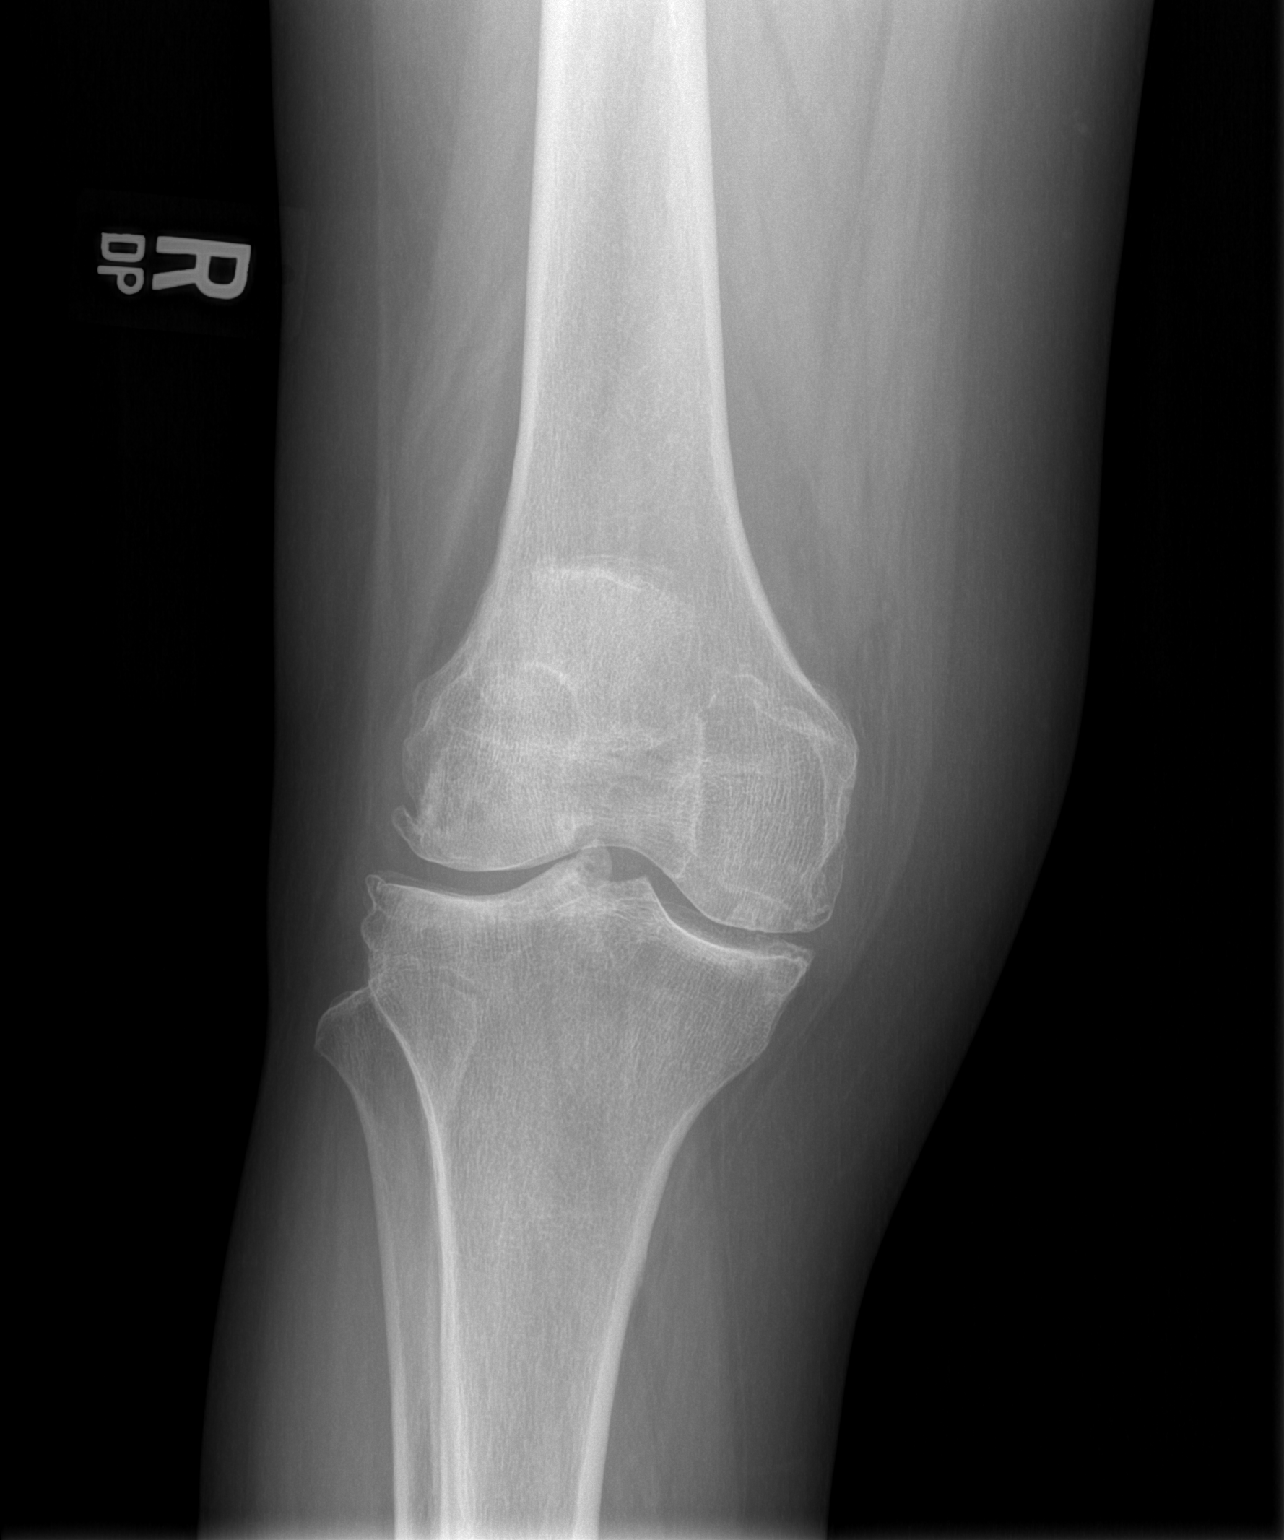

[t knee lat right]
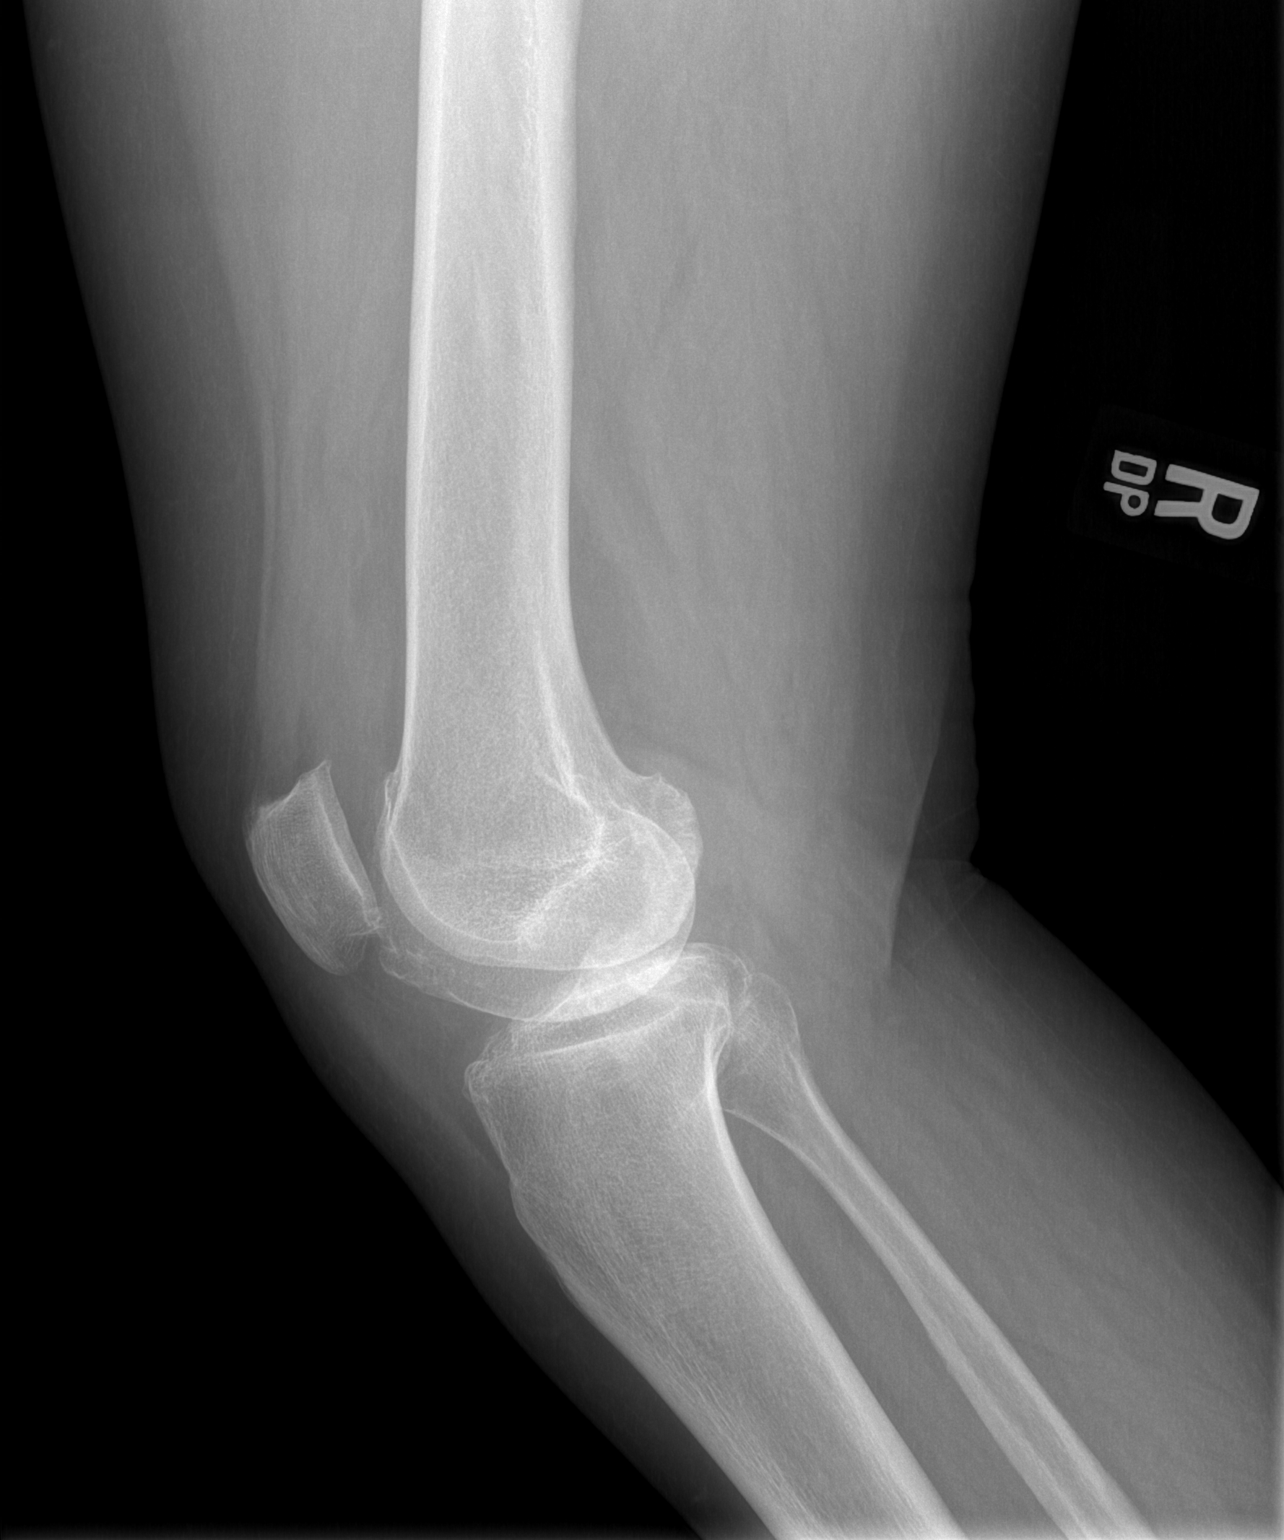

[2 of 2 positions shown; findings below may reference images not displayed]

FINDINGS: Small knee joint effusion. Prominent tricompartment degenerative
change. Loose body cannot be excluded. No acute bony or joint
abnormality. No evidence of fracture dislocation.
IMPRESSION: Small knee joint effusion. Prominent tricompartment degenerative
change. Loose body cannot be excluded. No acute bony abnormality.

## 2021-08-15 DIAGNOSIS — E7800 Pure hypercholesterolemia, unspecified: Secondary | ICD-10-CM | POA: Diagnosis not present

## 2021-08-15 DIAGNOSIS — I10 Essential (primary) hypertension: Secondary | ICD-10-CM | POA: Diagnosis not present

## 2021-08-15 DIAGNOSIS — E1169 Type 2 diabetes mellitus with other specified complication: Secondary | ICD-10-CM | POA: Diagnosis not present

## 2021-09-06 DIAGNOSIS — Z1231 Encounter for screening mammogram for malignant neoplasm of breast: Secondary | ICD-10-CM | POA: Diagnosis not present

## 2021-09-07 DIAGNOSIS — J40 Bronchitis, not specified as acute or chronic: Secondary | ICD-10-CM | POA: Diagnosis not present

## 2021-09-07 DIAGNOSIS — J4521 Mild intermittent asthma with (acute) exacerbation: Secondary | ICD-10-CM | POA: Diagnosis not present

## 2021-09-13 DIAGNOSIS — E7800 Pure hypercholesterolemia, unspecified: Secondary | ICD-10-CM | POA: Diagnosis not present

## 2021-09-13 DIAGNOSIS — I10 Essential (primary) hypertension: Secondary | ICD-10-CM | POA: Diagnosis not present

## 2021-09-13 DIAGNOSIS — E1169 Type 2 diabetes mellitus with other specified complication: Secondary | ICD-10-CM | POA: Diagnosis not present

## 2021-10-12 DIAGNOSIS — Z853 Personal history of malignant neoplasm of breast: Secondary | ICD-10-CM | POA: Diagnosis not present

## 2021-10-12 DIAGNOSIS — D1803 Hemangioma of intra-abdominal structures: Secondary | ICD-10-CM | POA: Diagnosis not present

## 2021-10-12 DIAGNOSIS — I10 Essential (primary) hypertension: Secondary | ICD-10-CM | POA: Diagnosis not present

## 2021-10-12 DIAGNOSIS — J4520 Mild intermittent asthma, uncomplicated: Secondary | ICD-10-CM | POA: Diagnosis not present

## 2021-10-12 DIAGNOSIS — Z0000 Encounter for general adult medical examination without abnormal findings: Secondary | ICD-10-CM | POA: Diagnosis not present

## 2021-10-12 DIAGNOSIS — I700 Atherosclerosis of aorta: Secondary | ICD-10-CM | POA: Diagnosis not present

## 2021-10-12 DIAGNOSIS — E7800 Pure hypercholesterolemia, unspecified: Secondary | ICD-10-CM | POA: Diagnosis not present

## 2021-10-12 DIAGNOSIS — E1169 Type 2 diabetes mellitus with other specified complication: Secondary | ICD-10-CM | POA: Diagnosis not present

## 2021-10-12 DIAGNOSIS — M179 Osteoarthritis of knee, unspecified: Secondary | ICD-10-CM | POA: Diagnosis not present

## 2021-10-14 DIAGNOSIS — I10 Essential (primary) hypertension: Secondary | ICD-10-CM | POA: Diagnosis not present

## 2021-10-14 DIAGNOSIS — K219 Gastro-esophageal reflux disease without esophagitis: Secondary | ICD-10-CM | POA: Diagnosis not present

## 2021-10-14 DIAGNOSIS — E1169 Type 2 diabetes mellitus with other specified complication: Secondary | ICD-10-CM | POA: Diagnosis not present

## 2021-10-14 DIAGNOSIS — E7800 Pure hypercholesterolemia, unspecified: Secondary | ICD-10-CM | POA: Diagnosis not present

## 2021-11-11 DIAGNOSIS — K219 Gastro-esophageal reflux disease without esophagitis: Secondary | ICD-10-CM | POA: Diagnosis not present

## 2021-11-11 DIAGNOSIS — E1169 Type 2 diabetes mellitus with other specified complication: Secondary | ICD-10-CM | POA: Diagnosis not present

## 2021-11-11 DIAGNOSIS — I10 Essential (primary) hypertension: Secondary | ICD-10-CM | POA: Diagnosis not present

## 2021-11-11 DIAGNOSIS — E7800 Pure hypercholesterolemia, unspecified: Secondary | ICD-10-CM | POA: Diagnosis not present

## 2021-11-29 DIAGNOSIS — Z780 Asymptomatic menopausal state: Secondary | ICD-10-CM | POA: Diagnosis not present

## 2021-11-29 DIAGNOSIS — M85851 Other specified disorders of bone density and structure, right thigh: Secondary | ICD-10-CM | POA: Diagnosis not present

## 2021-12-12 IMAGING — CT CT RENAL STONE PROTOCOL
2 of 4 series · 16 of 46 positions shown, 18 images · non-contrast
Comparison: Comparison made with July 24, 2016.

CLINICAL DATA: LEFT flank pain in a 70-year-old female.

EXAM:
CT ABDOMEN AND PELVIS WITHOUT CONTRAST
TECHNIQUE: Multidetector CT imaging of the abdomen and pelvis was performed
following the standard protocol without IV contrast.

[Series 2: axial st · axial · 0.86mm/px · z∈[-434,-70]mm · 13 of 85 slices shown, 15 images]
[im 6/85  soft-tissue]
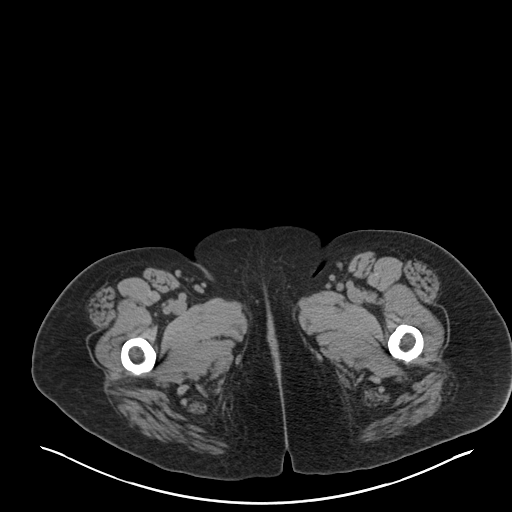
[im 6/85  bone]
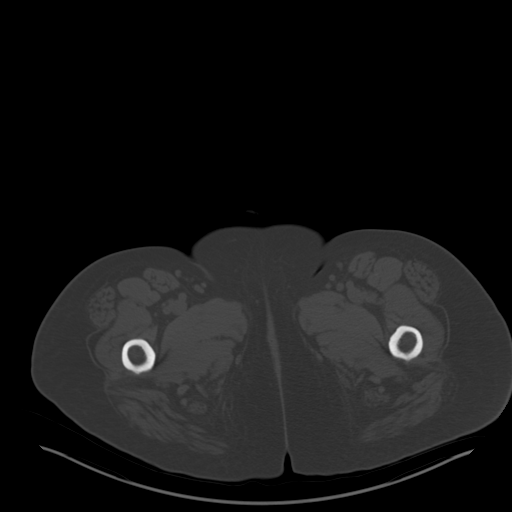
[im 11/85  soft-tissue]
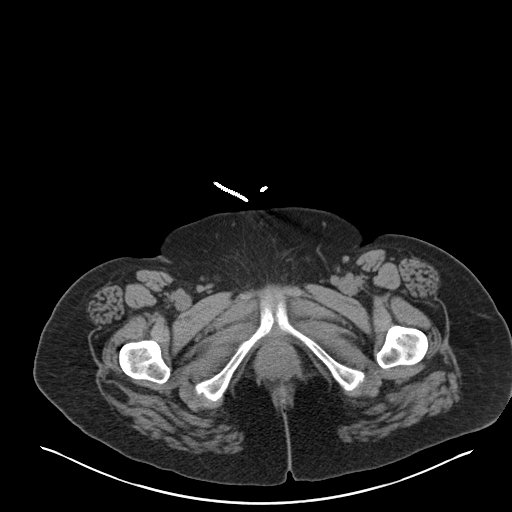
[im 16/85  soft-tissue]
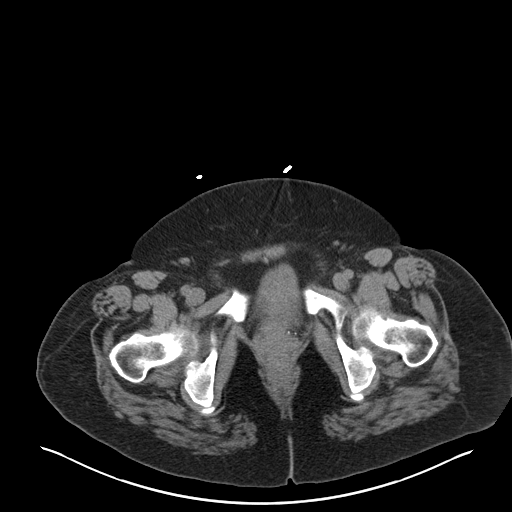
[im 27/85  soft-tissue]
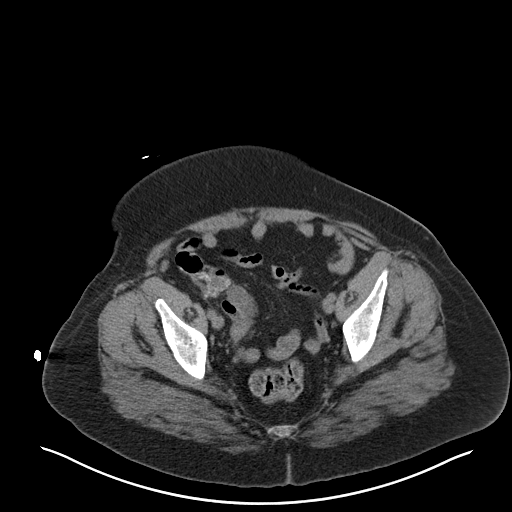
[im 32/85  soft-tissue]
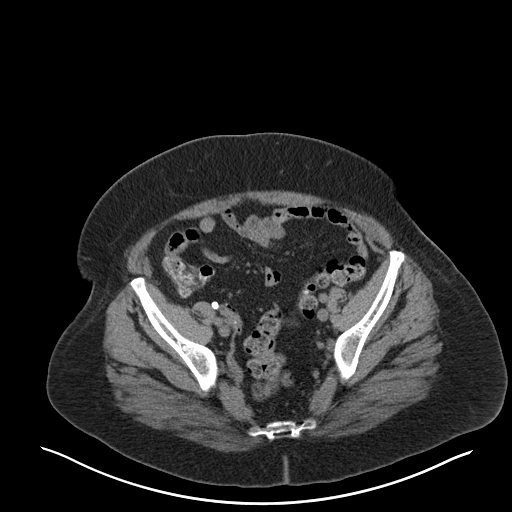
[im 37/85  soft-tissue]
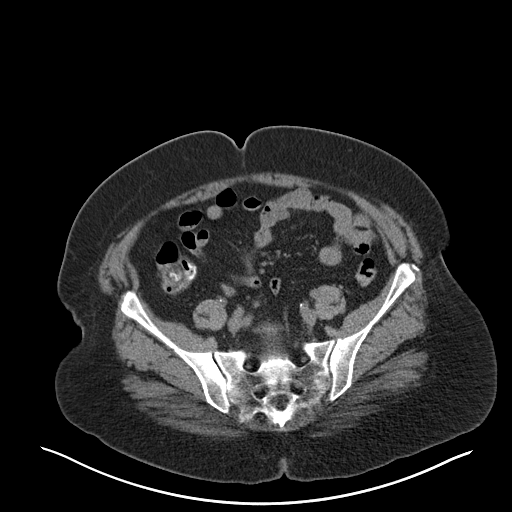
[im 43/85  soft-tissue]
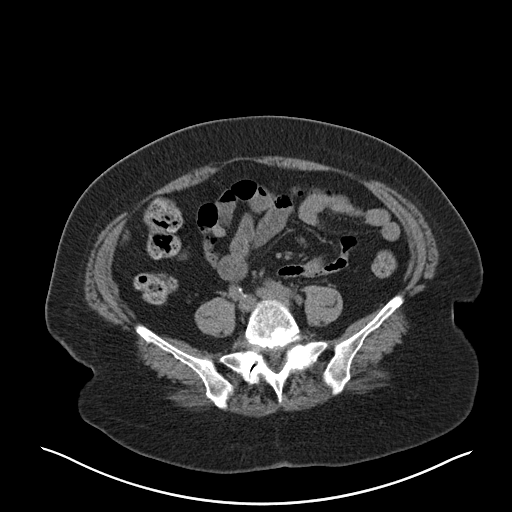
[im 48/85  soft-tissue]
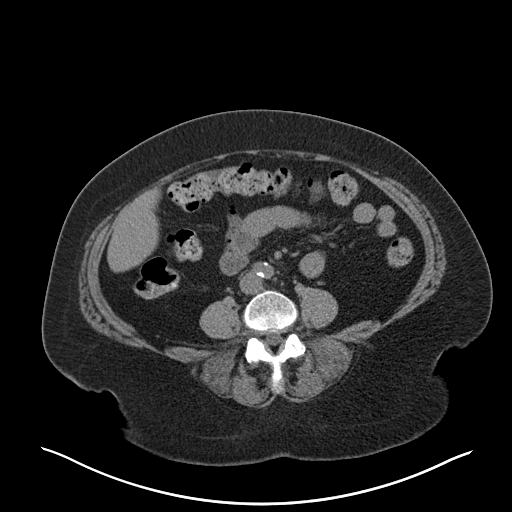
[im 53/85  soft-tissue]
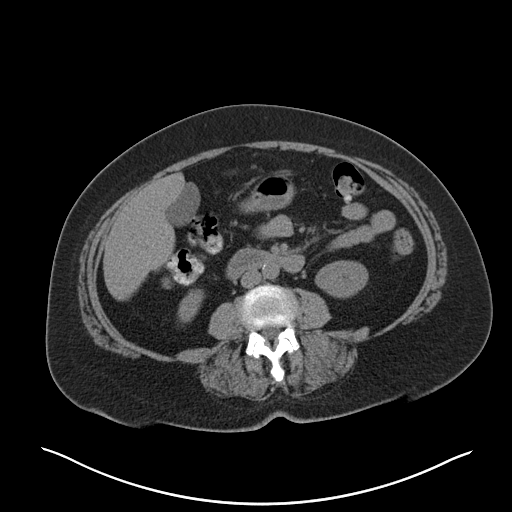
[im 53/85  bone]
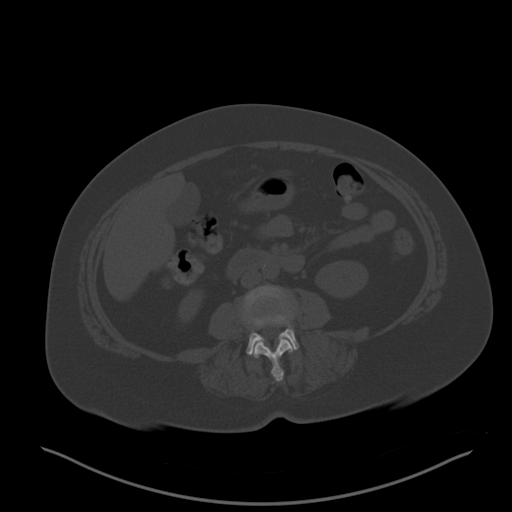
[im 58/85  soft-tissue]
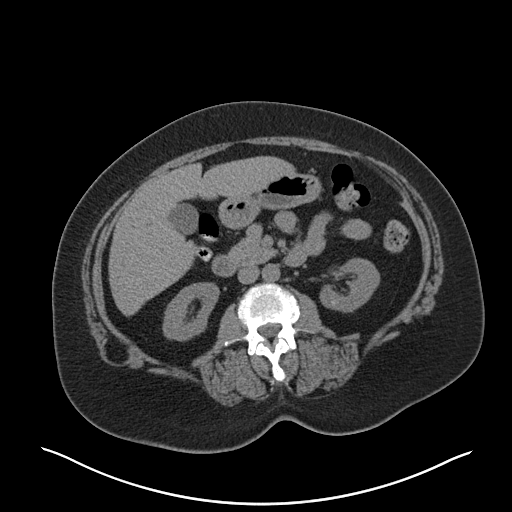
[im 69/85  soft-tissue]
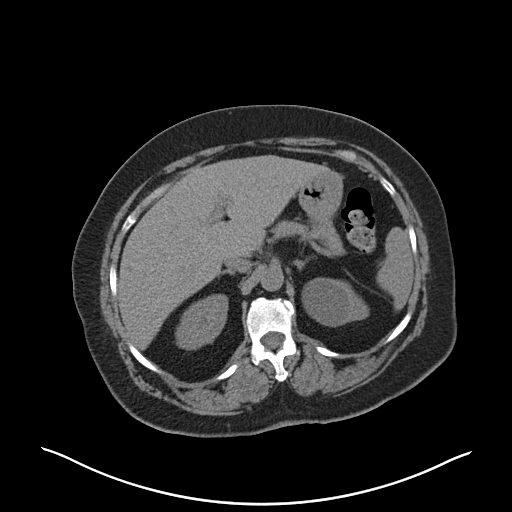
[im 74/85  soft-tissue]
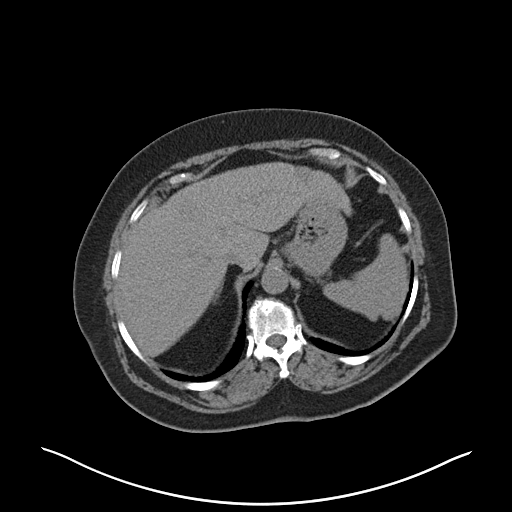
[im 79/85  soft-tissue]
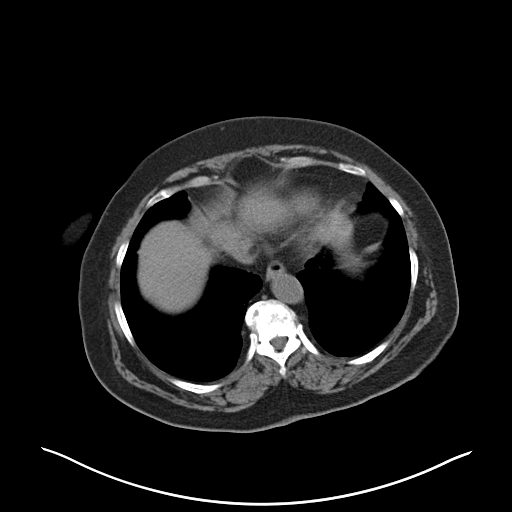

[Series 4: coronal · coronal · 0.81mm/px · 3 of 169 slices shown]
[im 57/169  soft-tissue]
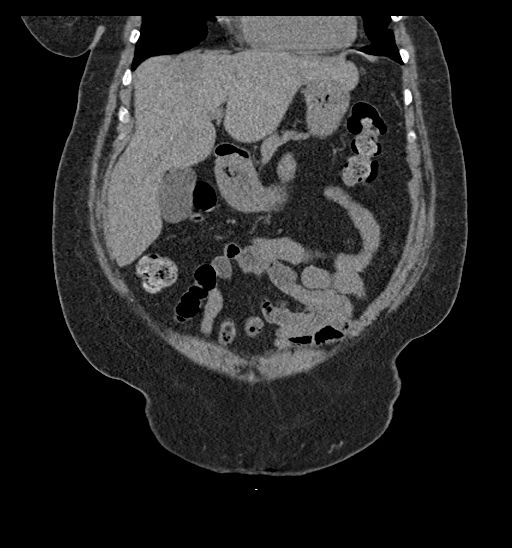
[im 75/169  soft-tissue]
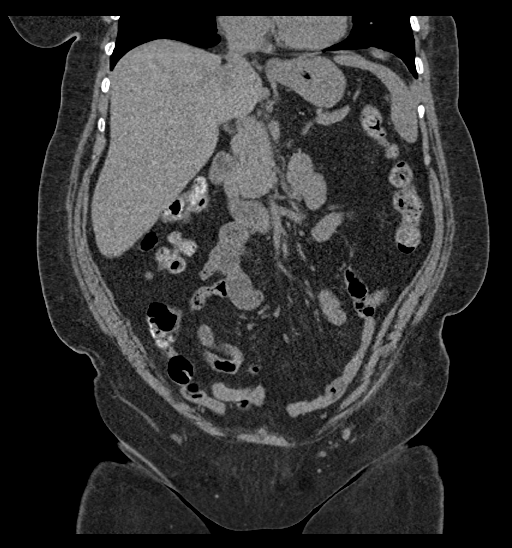
[im 94/169  soft-tissue]
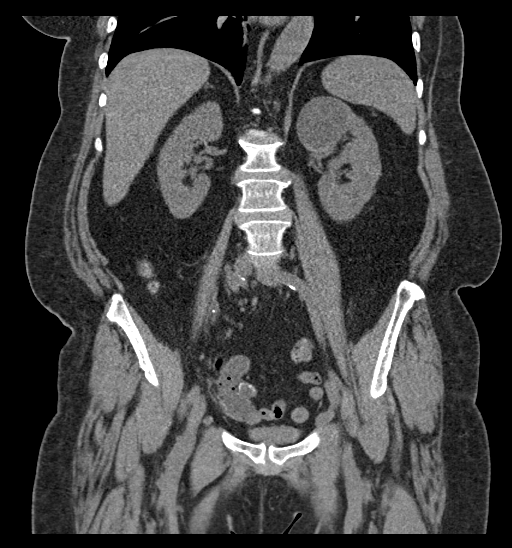

[16 of 46 positions shown; findings below may reference images not displayed]

FINDINGS: Lower chest: Incidental imaging of the lung bases without effusion
or sign of consolidation. Minimal atelectasis.

Hepatobiliary: Low-density hepatic lesions compatible with cysts are
unchanged dating back to 8745. Liver with otherwise smooth contours
and without signs of pericholecystic stranding. No gross biliary
duct dilation.

Pancreas: No contour abnormality or signs of inflammation. Fatty
density in the head of the pancreas compatible with lipoma or
invagination of adjacent fat into pancreatic parenchyma.

Spleen: Normal spleen.

Adrenals/Urinary Tract: Adrenal glands are normal.

Smooth contour the bilateral kidneys. No hydronephrosis. 2 mm
calculus in the lower pole the RIGHT kidney. Otherwise negative for
nephrolithiasis. No visible ureteral calculi. Hemorrhagic cyst
arises from the lower pole of the LEFT kidney with a larger cyst
arising from the upper pole the LEFT kidney measuring approximately
4.6 x 3.2 cm.

Stomach/Bowel: No acute gastrointestinal process.  Normal appendix.

Vascular/Lymphatic: Calcified atheromatous plaque of the abdominal
aorta. No aneurysmal dilation. Smooth contour of the IVC. There is
no gastrohepatic or hepatoduodenal ligament lymphadenopathy. No
retroperitoneal or mesenteric lymphadenopathy. No pelvic sidewall
lymphadenopathy.

Reproductive: Small fat containing umbilical hernia.

Other: Post hysterectomy without adnexal mass.

Musculoskeletal: Small lipoma along the RIGHT flank in the serratus
musculature. Spinal degenerative changes. No acute or destructive
bone process.
IMPRESSION: No acute findings in the abdomen or pelvis.

Nephrolithiasis with tiny calculus on the RIGHT.

Renal cysts on the LEFT.

Low-density hepatic lesions compatible with cysts are unchanged
dating back to 8745.

Small fat containing umbilical hernia.

Small lipoma along the RIGHT flank in the serratus musculature.

Aortic Atherosclerosis ([id number]).

## 2021-12-14 DIAGNOSIS — E1169 Type 2 diabetes mellitus with other specified complication: Secondary | ICD-10-CM | POA: Diagnosis not present

## 2021-12-14 DIAGNOSIS — E7800 Pure hypercholesterolemia, unspecified: Secondary | ICD-10-CM | POA: Diagnosis not present

## 2021-12-14 DIAGNOSIS — K219 Gastro-esophageal reflux disease without esophagitis: Secondary | ICD-10-CM | POA: Diagnosis not present

## 2021-12-14 DIAGNOSIS — I10 Essential (primary) hypertension: Secondary | ICD-10-CM | POA: Diagnosis not present

## 2022-01-09 DIAGNOSIS — M62838 Other muscle spasm: Secondary | ICD-10-CM | POA: Diagnosis not present

## 2022-01-09 DIAGNOSIS — M549 Dorsalgia, unspecified: Secondary | ICD-10-CM | POA: Diagnosis not present

## 2022-01-11 DIAGNOSIS — E7800 Pure hypercholesterolemia, unspecified: Secondary | ICD-10-CM | POA: Diagnosis not present

## 2022-01-11 DIAGNOSIS — I10 Essential (primary) hypertension: Secondary | ICD-10-CM | POA: Diagnosis not present

## 2022-01-11 DIAGNOSIS — K219 Gastro-esophageal reflux disease without esophagitis: Secondary | ICD-10-CM | POA: Diagnosis not present

## 2022-01-11 DIAGNOSIS — E1169 Type 2 diabetes mellitus with other specified complication: Secondary | ICD-10-CM | POA: Diagnosis not present

## 2022-01-11 DIAGNOSIS — J4520 Mild intermittent asthma, uncomplicated: Secondary | ICD-10-CM | POA: Diagnosis not present

## 2022-01-23 ENCOUNTER — Ambulatory Visit
Admission: RE | Admit: 2022-01-23 | Discharge: 2022-01-23 | Disposition: A | Discharge status: Home / Self Care | Payer: Medicare Other | Source: Ambulatory Visit | Attending: Internal Medicine | Admitting: Internal Medicine

## 2022-01-23 ENCOUNTER — Other Ambulatory Visit: Payer: Self-pay | Admitting: Internal Medicine

## 2022-01-23 DIAGNOSIS — M545 Low back pain, unspecified: Secondary | ICD-10-CM

## 2022-01-23 DIAGNOSIS — M549 Dorsalgia, unspecified: Secondary | ICD-10-CM | POA: Diagnosis not present

## 2022-01-31 DIAGNOSIS — E119 Type 2 diabetes mellitus without complications: Secondary | ICD-10-CM | POA: Diagnosis not present

## 2022-02-09 DIAGNOSIS — J4520 Mild intermittent asthma, uncomplicated: Secondary | ICD-10-CM | POA: Diagnosis not present

## 2022-02-09 DIAGNOSIS — I10 Essential (primary) hypertension: Secondary | ICD-10-CM | POA: Diagnosis not present

## 2022-02-09 DIAGNOSIS — K219 Gastro-esophageal reflux disease without esophagitis: Secondary | ICD-10-CM | POA: Diagnosis not present

## 2022-02-09 DIAGNOSIS — E1169 Type 2 diabetes mellitus with other specified complication: Secondary | ICD-10-CM | POA: Diagnosis not present

## 2022-02-09 DIAGNOSIS — E7800 Pure hypercholesterolemia, unspecified: Secondary | ICD-10-CM | POA: Diagnosis not present

## 2022-03-21 DIAGNOSIS — B3731 Acute candidiasis of vulva and vagina: Secondary | ICD-10-CM | POA: Diagnosis not present

## 2022-03-21 DIAGNOSIS — L900 Lichen sclerosus et atrophicus: Secondary | ICD-10-CM | POA: Diagnosis not present

## 2022-04-12 DIAGNOSIS — L900 Lichen sclerosus et atrophicus: Secondary | ICD-10-CM | POA: Diagnosis not present

## 2022-04-12 DIAGNOSIS — Z853 Personal history of malignant neoplasm of breast: Secondary | ICD-10-CM | POA: Diagnosis not present

## 2022-04-12 DIAGNOSIS — J309 Allergic rhinitis, unspecified: Secondary | ICD-10-CM | POA: Diagnosis not present

## 2022-04-12 DIAGNOSIS — J4520 Mild intermittent asthma, uncomplicated: Secondary | ICD-10-CM | POA: Diagnosis not present

## 2022-04-12 DIAGNOSIS — I10 Essential (primary) hypertension: Secondary | ICD-10-CM | POA: Diagnosis not present

## 2022-04-12 DIAGNOSIS — E7800 Pure hypercholesterolemia, unspecified: Secondary | ICD-10-CM | POA: Diagnosis not present

## 2022-04-12 DIAGNOSIS — I700 Atherosclerosis of aorta: Secondary | ICD-10-CM | POA: Diagnosis not present

## 2022-04-12 DIAGNOSIS — Z23 Encounter for immunization: Secondary | ICD-10-CM | POA: Diagnosis not present

## 2022-04-12 DIAGNOSIS — E1169 Type 2 diabetes mellitus with other specified complication: Secondary | ICD-10-CM | POA: Diagnosis not present

## 2022-05-10 DIAGNOSIS — J4520 Mild intermittent asthma, uncomplicated: Secondary | ICD-10-CM | POA: Diagnosis not present

## 2022-05-10 DIAGNOSIS — I10 Essential (primary) hypertension: Secondary | ICD-10-CM | POA: Diagnosis not present

## 2022-05-10 DIAGNOSIS — E7800 Pure hypercholesterolemia, unspecified: Secondary | ICD-10-CM | POA: Diagnosis not present

## 2022-05-10 DIAGNOSIS — K219 Gastro-esophageal reflux disease without esophagitis: Secondary | ICD-10-CM | POA: Diagnosis not present

## 2022-05-10 DIAGNOSIS — E1169 Type 2 diabetes mellitus with other specified complication: Secondary | ICD-10-CM | POA: Diagnosis not present

## 2022-06-15 DIAGNOSIS — K219 Gastro-esophageal reflux disease without esophagitis: Secondary | ICD-10-CM | POA: Diagnosis not present

## 2022-06-15 DIAGNOSIS — J4520 Mild intermittent asthma, uncomplicated: Secondary | ICD-10-CM | POA: Diagnosis not present

## 2022-06-15 DIAGNOSIS — E7800 Pure hypercholesterolemia, unspecified: Secondary | ICD-10-CM | POA: Diagnosis not present

## 2022-06-15 DIAGNOSIS — E1169 Type 2 diabetes mellitus with other specified complication: Secondary | ICD-10-CM | POA: Diagnosis not present

## 2022-06-15 DIAGNOSIS — I10 Essential (primary) hypertension: Secondary | ICD-10-CM | POA: Diagnosis not present

## 2022-07-13 DIAGNOSIS — J4520 Mild intermittent asthma, uncomplicated: Secondary | ICD-10-CM | POA: Diagnosis not present

## 2022-07-13 DIAGNOSIS — I10 Essential (primary) hypertension: Secondary | ICD-10-CM | POA: Diagnosis not present

## 2022-07-13 DIAGNOSIS — E7800 Pure hypercholesterolemia, unspecified: Secondary | ICD-10-CM | POA: Diagnosis not present

## 2022-07-13 DIAGNOSIS — E1169 Type 2 diabetes mellitus with other specified complication: Secondary | ICD-10-CM | POA: Diagnosis not present

## 2022-07-13 DIAGNOSIS — K219 Gastro-esophageal reflux disease without esophagitis: Secondary | ICD-10-CM | POA: Diagnosis not present

## 2022-08-09 DIAGNOSIS — K219 Gastro-esophageal reflux disease without esophagitis: Secondary | ICD-10-CM | POA: Diagnosis not present

## 2022-08-09 DIAGNOSIS — E7800 Pure hypercholesterolemia, unspecified: Secondary | ICD-10-CM | POA: Diagnosis not present

## 2022-08-09 DIAGNOSIS — E1169 Type 2 diabetes mellitus with other specified complication: Secondary | ICD-10-CM | POA: Diagnosis not present

## 2022-08-09 DIAGNOSIS — I10 Essential (primary) hypertension: Secondary | ICD-10-CM | POA: Diagnosis not present

## 2022-08-09 DIAGNOSIS — J4520 Mild intermittent asthma, uncomplicated: Secondary | ICD-10-CM | POA: Diagnosis not present

## 2022-08-10 DIAGNOSIS — H35363 Drusen (degenerative) of macula, bilateral: Secondary | ICD-10-CM | POA: Diagnosis not present

## 2022-08-10 DIAGNOSIS — H43393 Other vitreous opacities, bilateral: Secondary | ICD-10-CM | POA: Diagnosis not present

## 2022-08-10 DIAGNOSIS — H43813 Vitreous degeneration, bilateral: Secondary | ICD-10-CM | POA: Diagnosis not present

## 2022-08-10 DIAGNOSIS — H2513 Age-related nuclear cataract, bilateral: Secondary | ICD-10-CM | POA: Diagnosis not present

## 2022-08-25 ENCOUNTER — Other Ambulatory Visit: Payer: Self-pay | Admitting: Internal Medicine

## 2022-08-25 ENCOUNTER — Ambulatory Visit
Admission: RE | Admit: 2022-08-25 | Discharge: 2022-08-25 | Disposition: A | Discharge status: Home / Self Care | Payer: 59 | Source: Ambulatory Visit | Attending: Internal Medicine | Admitting: Internal Medicine

## 2022-08-25 DIAGNOSIS — R0781 Pleurodynia: Secondary | ICD-10-CM

## 2022-09-06 DIAGNOSIS — R059 Cough, unspecified: Secondary | ICD-10-CM | POA: Diagnosis not present

## 2022-09-06 DIAGNOSIS — Z03818 Encounter for observation for suspected exposure to other biological agents ruled out: Secondary | ICD-10-CM | POA: Diagnosis not present

## 2022-09-12 DIAGNOSIS — Z1231 Encounter for screening mammogram for malignant neoplasm of breast: Secondary | ICD-10-CM | POA: Diagnosis not present

## 2022-10-16 DIAGNOSIS — E7800 Pure hypercholesterolemia, unspecified: Secondary | ICD-10-CM | POA: Diagnosis not present

## 2022-10-16 DIAGNOSIS — E119 Type 2 diabetes mellitus without complications: Secondary | ICD-10-CM | POA: Diagnosis not present

## 2022-10-16 DIAGNOSIS — Z853 Personal history of malignant neoplasm of breast: Secondary | ICD-10-CM | POA: Diagnosis not present

## 2022-10-16 DIAGNOSIS — J40 Bronchitis, not specified as acute or chronic: Secondary | ICD-10-CM | POA: Diagnosis not present

## 2022-10-16 DIAGNOSIS — J4520 Mild intermittent asthma, uncomplicated: Secondary | ICD-10-CM | POA: Diagnosis not present

## 2022-10-16 DIAGNOSIS — I700 Atherosclerosis of aorta: Secondary | ICD-10-CM | POA: Diagnosis not present

## 2022-10-16 DIAGNOSIS — Z0000 Encounter for general adult medical examination without abnormal findings: Secondary | ICD-10-CM | POA: Diagnosis not present

## 2022-10-16 DIAGNOSIS — E1169 Type 2 diabetes mellitus with other specified complication: Secondary | ICD-10-CM | POA: Diagnosis not present

## 2022-10-16 DIAGNOSIS — I10 Essential (primary) hypertension: Secondary | ICD-10-CM | POA: Diagnosis not present

## 2022-10-16 DIAGNOSIS — E559 Vitamin D deficiency, unspecified: Secondary | ICD-10-CM | POA: Diagnosis not present

## 2022-10-19 DIAGNOSIS — H25043 Posterior subcapsular polar age-related cataract, bilateral: Secondary | ICD-10-CM | POA: Diagnosis not present

## 2022-10-19 DIAGNOSIS — H18413 Arcus senilis, bilateral: Secondary | ICD-10-CM | POA: Diagnosis not present

## 2022-10-19 DIAGNOSIS — H2513 Age-related nuclear cataract, bilateral: Secondary | ICD-10-CM | POA: Diagnosis not present

## 2022-10-19 DIAGNOSIS — H25013 Cortical age-related cataract, bilateral: Secondary | ICD-10-CM | POA: Diagnosis not present

## 2022-10-19 DIAGNOSIS — H2511 Age-related nuclear cataract, right eye: Secondary | ICD-10-CM | POA: Diagnosis not present

## 2022-12-01 DIAGNOSIS — H2511 Age-related nuclear cataract, right eye: Secondary | ICD-10-CM | POA: Diagnosis not present

## 2022-12-01 DIAGNOSIS — H43391 Other vitreous opacities, right eye: Secondary | ICD-10-CM | POA: Diagnosis not present

## 2023-01-18 DIAGNOSIS — H2512 Age-related nuclear cataract, left eye: Secondary | ICD-10-CM | POA: Diagnosis not present

## 2023-02-23 DIAGNOSIS — H43392 Other vitreous opacities, left eye: Secondary | ICD-10-CM | POA: Diagnosis not present

## 2023-02-23 DIAGNOSIS — H43812 Vitreous degeneration, left eye: Secondary | ICD-10-CM | POA: Diagnosis not present

## 2023-02-23 DIAGNOSIS — H2512 Age-related nuclear cataract, left eye: Secondary | ICD-10-CM | POA: Diagnosis not present

## 2023-03-27 DIAGNOSIS — Z9189 Other specified personal risk factors, not elsewhere classified: Secondary | ICD-10-CM | POA: Diagnosis not present

## 2023-04-23 DIAGNOSIS — E119 Type 2 diabetes mellitus without complications: Secondary | ICD-10-CM | POA: Diagnosis not present

## 2023-04-23 DIAGNOSIS — Z853 Personal history of malignant neoplasm of breast: Secondary | ICD-10-CM | POA: Diagnosis not present

## 2023-04-23 DIAGNOSIS — E1169 Type 2 diabetes mellitus with other specified complication: Secondary | ICD-10-CM | POA: Diagnosis not present

## 2023-04-23 DIAGNOSIS — Z23 Encounter for immunization: Secondary | ICD-10-CM | POA: Diagnosis not present

## 2023-04-23 DIAGNOSIS — E559 Vitamin D deficiency, unspecified: Secondary | ICD-10-CM | POA: Diagnosis not present

## 2023-04-23 DIAGNOSIS — D1803 Hemangioma of intra-abdominal structures: Secondary | ICD-10-CM | POA: Diagnosis not present

## 2023-04-23 DIAGNOSIS — I700 Atherosclerosis of aorta: Secondary | ICD-10-CM | POA: Diagnosis not present

## 2023-04-23 DIAGNOSIS — I10 Essential (primary) hypertension: Secondary | ICD-10-CM | POA: Diagnosis not present

## 2023-04-23 DIAGNOSIS — E7800 Pure hypercholesterolemia, unspecified: Secondary | ICD-10-CM | POA: Diagnosis not present

## 2023-04-23 DIAGNOSIS — J4520 Mild intermittent asthma, uncomplicated: Secondary | ICD-10-CM | POA: Diagnosis not present

## 2023-05-14 DIAGNOSIS — R0781 Pleurodynia: Secondary | ICD-10-CM | POA: Diagnosis not present

## 2023-05-14 DIAGNOSIS — R0789 Other chest pain: Secondary | ICD-10-CM | POA: Diagnosis not present

## 2023-05-21 DIAGNOSIS — M62838 Other muscle spasm: Secondary | ICD-10-CM | POA: Diagnosis not present

## 2023-05-21 DIAGNOSIS — I10 Essential (primary) hypertension: Secondary | ICD-10-CM | POA: Diagnosis not present

## 2023-06-01 DIAGNOSIS — M549 Dorsalgia, unspecified: Secondary | ICD-10-CM | POA: Diagnosis not present

## 2023-06-13 ENCOUNTER — Other Ambulatory Visit (HOSPITAL_COMMUNITY): Payer: Self-pay | Admitting: Internal Medicine

## 2023-06-13 DIAGNOSIS — R109 Unspecified abdominal pain: Secondary | ICD-10-CM

## 2023-07-04 ENCOUNTER — Ambulatory Visit (HOSPITAL_COMMUNITY): Payer: Medicare Other

## 2023-07-10 ENCOUNTER — Ambulatory Visit (HOSPITAL_COMMUNITY)
Admission: RE | Admit: 2023-07-10 | Discharge: 2023-07-10 | Disposition: A | Discharge status: Home / Self Care | Payer: Medicare Other | Source: Ambulatory Visit | Attending: Internal Medicine | Admitting: Internal Medicine

## 2023-07-10 DIAGNOSIS — R109 Unspecified abdominal pain: Secondary | ICD-10-CM | POA: Diagnosis present

## 2023-07-10 MED ORDER — IOHEXOL 300 MG/ML  SOLN
100.0000 mL | Freq: Once | INTRAMUSCULAR | Status: AC | PRN
  Administered 2023-07-10: 100 mL via INTRAVENOUS

## 2024-07-07 NOTE — Progress Notes (Signed)
 Syncere Kaminski                                           MRN: [id number]   07/07/2024   The [facility] Quality Team Specialist reviewed this patient medical record for the purposes of chart review for care gap closure. The following were reviewed: chart review for care gap closure-glycemic status assessment.    [facility] Quality Team
